# Patient Record
Sex: Female | Born: 1955
Health system: Southern US, Community
[De-identification: ages and names within clinical notes are randomized; demographics above are authoritative.]

## PROBLEM LIST (undated history)

## (undated) DIAGNOSIS — M25472 Effusion, left ankle: Secondary | ICD-10-CM

## (undated) DIAGNOSIS — M25471 Effusion, right ankle: Secondary | ICD-10-CM

## (undated) DIAGNOSIS — M25475 Effusion, left foot: Secondary | ICD-10-CM

## (undated) DIAGNOSIS — M2550 Pain in unspecified joint: Secondary | ICD-10-CM

## (undated) DIAGNOSIS — Z720 Tobacco use: Secondary | ICD-10-CM

## (undated) DIAGNOSIS — K219 Gastro-esophageal reflux disease without esophagitis: Secondary | ICD-10-CM

## (undated) DIAGNOSIS — K760 Fatty (change of) liver, not elsewhere classified: Secondary | ICD-10-CM

## (undated) DIAGNOSIS — I2510 Atherosclerotic heart disease of native coronary artery without angina pectoris: Secondary | ICD-10-CM

## (undated) DIAGNOSIS — M7122 Synovial cyst of popliteal space [Baker], left knee: Secondary | ICD-10-CM

## (undated) DIAGNOSIS — M549 Dorsalgia, unspecified: Secondary | ICD-10-CM

## (undated) DIAGNOSIS — M25474 Effusion, right foot: Secondary | ICD-10-CM

## (undated) DIAGNOSIS — I219 Acute myocardial infarction, unspecified: Secondary | ICD-10-CM

## (undated) HISTORY — DX: Acute myocardial infarction, unspecified: I21.9

## (undated) HISTORY — DX: Fatty (change of) liver, not elsewhere classified: K76.0

## (undated) HISTORY — DX: Dorsalgia, unspecified: M54.9

## (undated) HISTORY — DX: Effusion, right ankle: M25.471

## (undated) HISTORY — DX: Gastro-esophageal reflux disease without esophagitis: K21.9

## (undated) HISTORY — DX: Effusion, right foot: M25.474

## (undated) HISTORY — DX: Pain in unspecified joint: M25.50

## (undated) HISTORY — DX: Effusion, left foot: M25.475

## (undated) HISTORY — DX: Effusion, left ankle: M25.472

## (undated) HISTORY — PX: TONSILLECTOMY: SUR1361

---

## 2007-02-21 ENCOUNTER — Other Ambulatory Visit: Admission: RE | Admit: 2007-02-21 | Discharge: 2007-02-21 | Payer: Self-pay | Admitting: Obstetrics & Gynecology

## 2007-03-25 ENCOUNTER — Encounter: Admission: RE | Admit: 2007-03-25 | Discharge: 2007-03-25 | Payer: Self-pay | Admitting: Obstetrics & Gynecology

## 2007-04-23 ENCOUNTER — Ambulatory Visit (HOSPITAL_COMMUNITY): Admission: RE | Admit: 2007-04-23 | Discharge: 2007-04-23 | Payer: Self-pay | Admitting: Gastroenterology

## 2007-04-26 ENCOUNTER — Encounter: Admission: RE | Admit: 2007-04-26 | Discharge: 2007-04-26 | Payer: Self-pay | Admitting: Gastroenterology

## 2009-07-24 ENCOUNTER — Emergency Department (HOSPITAL_BASED_OUTPATIENT_CLINIC_OR_DEPARTMENT_OTHER): Admission: EM | Admit: 2009-07-24 | Discharge: 2009-07-24 | Payer: Self-pay | Admitting: Emergency Medicine

## 2010-01-19 ENCOUNTER — Ambulatory Visit: Payer: Self-pay | Admitting: Family Medicine

## 2010-03-19 LAB — ROCKY MTN SPOTTED FVR AB, IGG-BLOOD: RMSF IgG: 0.07 IV

## 2010-03-19 LAB — B. BURGDORFI ANTIBODIES: B burgdorferi Ab IgG+IgM: 0.28 {ISR}

## 2012-06-18 DIAGNOSIS — Z8349 Family history of other endocrine, nutritional and metabolic diseases: Secondary | ICD-10-CM | POA: Insufficient documentation

## 2012-06-18 DIAGNOSIS — K219 Gastro-esophageal reflux disease without esophagitis: Secondary | ICD-10-CM | POA: Insufficient documentation

## 2012-06-18 DIAGNOSIS — E785 Hyperlipidemia, unspecified: Secondary | ICD-10-CM | POA: Insufficient documentation

## 2012-06-18 DIAGNOSIS — F17210 Nicotine dependence, cigarettes, uncomplicated: Secondary | ICD-10-CM

## 2012-06-18 HISTORY — DX: Nicotine dependence, cigarettes, uncomplicated: F17.210

## 2012-06-19 ENCOUNTER — Other Ambulatory Visit: Payer: Self-pay

## 2012-06-19 DIAGNOSIS — Z1231 Encounter for screening mammogram for malignant neoplasm of breast: Secondary | ICD-10-CM

## 2012-06-24 ENCOUNTER — Ambulatory Visit: Payer: Self-pay

## 2012-08-12 ENCOUNTER — Ambulatory Visit: Payer: Self-pay | Admitting: Obstetrics and Gynecology

## 2013-11-17 ENCOUNTER — Other Ambulatory Visit: Payer: Self-pay | Admitting: Family Medicine

## 2013-11-17 DIAGNOSIS — Z1231 Encounter for screening mammogram for malignant neoplasm of breast: Secondary | ICD-10-CM

## 2013-11-18 ENCOUNTER — Ambulatory Visit
Admission: RE | Admit: 2013-11-18 | Discharge: 2013-11-18 | Disposition: A | Discharge status: Home / Self Care | Payer: 59 | Source: Ambulatory Visit | Attending: Family Medicine | Admitting: Family Medicine

## 2013-11-18 DIAGNOSIS — Z1231 Encounter for screening mammogram for malignant neoplasm of breast: Secondary | ICD-10-CM

## 2013-12-02 ENCOUNTER — Other Ambulatory Visit: Payer: Self-pay | Admitting: Family Medicine

## 2013-12-02 DIAGNOSIS — F17210 Nicotine dependence, cigarettes, uncomplicated: Secondary | ICD-10-CM

## 2013-12-23 ENCOUNTER — Encounter (INDEPENDENT_AMBULATORY_CARE_PROVIDER_SITE_OTHER): Payer: Self-pay

## 2013-12-23 ENCOUNTER — Ambulatory Visit
Admission: RE | Admit: 2013-12-23 | Discharge: 2013-12-23 | Disposition: A | Discharge status: Home / Self Care | Payer: No Typology Code available for payment source | Source: Ambulatory Visit | Attending: Family Medicine | Admitting: Family Medicine

## 2013-12-23 DIAGNOSIS — F17210 Nicotine dependence, cigarettes, uncomplicated: Secondary | ICD-10-CM

## 2014-07-27 ENCOUNTER — Other Ambulatory Visit: Payer: Self-pay | Admitting: Family Medicine

## 2014-07-27 DIAGNOSIS — M25562 Pain in left knee: Secondary | ICD-10-CM

## 2014-07-28 ENCOUNTER — Ambulatory Visit
Admission: RE | Admit: 2014-07-28 | Discharge: 2014-07-28 | Disposition: A | Discharge status: Home / Self Care | Payer: 59 | Source: Ambulatory Visit | Attending: Family Medicine | Admitting: Family Medicine

## 2014-07-28 DIAGNOSIS — M25562 Pain in left knee: Secondary | ICD-10-CM

## 2016-02-16 ENCOUNTER — Other Ambulatory Visit: Payer: Self-pay | Admitting: Family Medicine

## 2016-02-16 DIAGNOSIS — Z1231 Encounter for screening mammogram for malignant neoplasm of breast: Secondary | ICD-10-CM

## 2017-04-19 DIAGNOSIS — R519 Headache, unspecified: Secondary | ICD-10-CM | POA: Insufficient documentation

## 2017-04-19 DIAGNOSIS — L821 Other seborrheic keratosis: Secondary | ICD-10-CM | POA: Insufficient documentation

## 2017-04-19 DIAGNOSIS — M7989 Other specified soft tissue disorders: Secondary | ICD-10-CM | POA: Insufficient documentation

## 2017-04-19 DIAGNOSIS — M5412 Radiculopathy, cervical region: Secondary | ICD-10-CM | POA: Insufficient documentation

## 2017-04-19 DIAGNOSIS — M7122 Synovial cyst of popliteal space [Baker], left knee: Secondary | ICD-10-CM | POA: Insufficient documentation

## 2017-04-19 DIAGNOSIS — G518 Other disorders of facial nerve: Secondary | ICD-10-CM

## 2017-04-19 HISTORY — DX: Other disorders of facial nerve: G51.8

## 2017-12-22 ENCOUNTER — Encounter (HOSPITAL_COMMUNITY): Payer: Self-pay | Admitting: Emergency Medicine

## 2017-12-22 ENCOUNTER — Other Ambulatory Visit: Payer: Self-pay

## 2017-12-22 ENCOUNTER — Inpatient Hospital Stay (HOSPITAL_COMMUNITY)
Admission: EM | Admit: 2017-12-22 | Discharge: 2017-12-25 | DRG: 247 | Disposition: A | Discharge status: Home / Self Care | Payer: 59 | Attending: Internal Medicine | Admitting: Internal Medicine

## 2017-12-22 DIAGNOSIS — K219 Gastro-esophageal reflux disease without esophagitis: Secondary | ICD-10-CM | POA: Diagnosis present

## 2017-12-22 DIAGNOSIS — Z880 Allergy status to penicillin: Secondary | ICD-10-CM

## 2017-12-22 DIAGNOSIS — Z7289 Other problems related to lifestyle: Secondary | ICD-10-CM

## 2017-12-22 DIAGNOSIS — I214 Non-ST elevation (NSTEMI) myocardial infarction: Principal | ICD-10-CM | POA: Diagnosis present

## 2017-12-22 DIAGNOSIS — Z789 Other specified health status: Secondary | ICD-10-CM

## 2017-12-22 DIAGNOSIS — Z6841 Body Mass Index (BMI) 40.0 and over, adult: Secondary | ICD-10-CM

## 2017-12-22 DIAGNOSIS — I10 Essential (primary) hypertension: Secondary | ICD-10-CM | POA: Diagnosis present

## 2017-12-22 DIAGNOSIS — E7800 Pure hypercholesterolemia, unspecified: Secondary | ICD-10-CM | POA: Diagnosis present

## 2017-12-22 DIAGNOSIS — R030 Elevated blood-pressure reading, without diagnosis of hypertension: Secondary | ICD-10-CM | POA: Diagnosis present

## 2017-12-22 DIAGNOSIS — F17210 Nicotine dependence, cigarettes, uncomplicated: Secondary | ICD-10-CM | POA: Diagnosis present

## 2017-12-22 DIAGNOSIS — Z720 Tobacco use: Secondary | ICD-10-CM | POA: Diagnosis present

## 2017-12-22 DIAGNOSIS — E785 Hyperlipidemia, unspecified: Secondary | ICD-10-CM | POA: Diagnosis present

## 2017-12-22 HISTORY — DX: Morbid (severe) obesity due to excess calories: E66.01

## 2017-12-22 HISTORY — DX: Tobacco use: Z72.0

## 2017-12-22 HISTORY — DX: Synovial cyst of popliteal space (Baker), left knee: M71.22

## 2017-12-22 HISTORY — DX: Atherosclerotic heart disease of native coronary artery without angina pectoris: I25.10

## 2017-12-22 NOTE — ED Triage Notes (Addendum)
Pt presents with central chest pain with radiation to back and jaw. Patient states the pain happened earlier, she took aspirin and it subsided. Patient states that while she was sleeping the pain came back worse which brought her to be seen. Patient states had one bout of N/V.

## 2017-12-23 ENCOUNTER — Inpatient Hospital Stay (HOSPITAL_COMMUNITY): Payer: 59

## 2017-12-23 ENCOUNTER — Encounter (HOSPITAL_COMMUNITY): Payer: Self-pay | Admitting: Internal Medicine

## 2017-12-23 ENCOUNTER — Emergency Department (HOSPITAL_COMMUNITY): Payer: 59

## 2017-12-23 DIAGNOSIS — I10 Essential (primary) hypertension: Secondary | ICD-10-CM | POA: Diagnosis present

## 2017-12-23 DIAGNOSIS — I361 Nonrheumatic tricuspid (valve) insufficiency: Secondary | ICD-10-CM

## 2017-12-23 DIAGNOSIS — R030 Elevated blood-pressure reading, without diagnosis of hypertension: Secondary | ICD-10-CM | POA: Diagnosis present

## 2017-12-23 DIAGNOSIS — F17210 Nicotine dependence, cigarettes, uncomplicated: Secondary | ICD-10-CM | POA: Diagnosis present

## 2017-12-23 DIAGNOSIS — E7800 Pure hypercholesterolemia, unspecified: Secondary | ICD-10-CM | POA: Diagnosis present

## 2017-12-23 DIAGNOSIS — E785 Hyperlipidemia, unspecified: Secondary | ICD-10-CM | POA: Diagnosis present

## 2017-12-23 DIAGNOSIS — K219 Gastro-esophageal reflux disease without esophagitis: Secondary | ICD-10-CM | POA: Diagnosis present

## 2017-12-23 DIAGNOSIS — Z720 Tobacco use: Secondary | ICD-10-CM | POA: Diagnosis not present

## 2017-12-23 DIAGNOSIS — Z789 Other specified health status: Secondary | ICD-10-CM

## 2017-12-23 DIAGNOSIS — I214 Non-ST elevation (NSTEMI) myocardial infarction: Secondary | ICD-10-CM | POA: Diagnosis present

## 2017-12-23 DIAGNOSIS — Z880 Allergy status to penicillin: Secondary | ICD-10-CM | POA: Diagnosis not present

## 2017-12-23 DIAGNOSIS — Z7289 Other problems related to lifestyle: Secondary | ICD-10-CM

## 2017-12-23 DIAGNOSIS — Z6841 Body Mass Index (BMI) 40.0 and over, adult: Secondary | ICD-10-CM | POA: Diagnosis not present

## 2017-12-23 HISTORY — DX: Elevated blood-pressure reading, without diagnosis of hypertension: R03.0

## 2017-12-23 LAB — HEPATIC FUNCTION PANEL
ALBUMIN: 3.4 g/dL — AB (ref 3.5–5.0)
ALK PHOS: 65 U/L (ref 38–126)
ALT: 21 U/L (ref 0–44)
AST: 26 U/L (ref 15–41)
BILIRUBIN TOTAL: 0.8 mg/dL (ref 0.3–1.2)
Bilirubin, Direct: <0.1 mg/dL (ref 0.0–0.2)
TOTAL PROTEIN: 6.2 g/dL — AB (ref 6.5–8.1)

## 2017-12-23 LAB — CBC
HCT: 41.1 % (ref 36.0–46.0)
HCT: 42.9 % (ref 36.0–46.0)
Hemoglobin: 13.8 g/dL (ref 12.0–15.0)
Hemoglobin: 14.4 g/dL (ref 12.0–15.0)
MCH: 30.7 pg (ref 26.0–34.0)
MCH: 31.2 pg (ref 26.0–34.0)
MCHC: 33.6 g/dL (ref 30.0–36.0)
MCHC: 33.6 g/dL (ref 30.0–36.0)
MCV: 91.5 fL (ref 80.0–100.0)
MCV: 92.9 fL (ref 80.0–100.0)
PLATELETS: 235 10*3/uL (ref 150–400)
Platelets: 235 10*3/uL (ref 150–400)
RBC: 4.49 MIL/uL (ref 3.87–5.11)
RBC: 4.62 MIL/uL (ref 3.87–5.11)
RDW: 12.5 % (ref 11.5–15.5)
RDW: 12.5 % (ref 11.5–15.5)
WBC: 8.4 10*3/uL (ref 4.0–10.5)
WBC: 8.9 10*3/uL (ref 4.0–10.5)
nRBC: 0 % (ref 0.0–0.2)
nRBC: 0 % (ref 0.0–0.2)

## 2017-12-23 LAB — BASIC METABOLIC PANEL
ANION GAP: 11 (ref 5–15)
BUN: 19 mg/dL (ref 8–23)
CALCIUM: 9 mg/dL (ref 8.9–10.3)
CO2: 22 mmol/L (ref 22–32)
CREATININE: 0.82 mg/dL (ref 0.44–1.00)
Chloride: 108 mmol/L (ref 98–111)
GLUCOSE: 114 mg/dL — AB (ref 70–99)
Potassium: 3.6 mmol/L (ref 3.5–5.1)
Sodium: 141 mmol/L (ref 135–145)

## 2017-12-23 LAB — I-STAT TROPONIN, ED: TROPONIN I, POC: 0.25 ng/mL — AB (ref 0.00–0.08)

## 2017-12-23 LAB — ECHOCARDIOGRAM COMPLETE
Height: 65 in
Weight: 4180.8 oz

## 2017-12-23 LAB — HEPARIN LEVEL (UNFRACTIONATED)
HEPARIN UNFRACTIONATED: 0.43 [IU]/mL (ref 0.30–0.70)
Heparin Unfractionated: 0.17 [IU]/mL — ABNORMAL LOW (ref 0.30–0.70)

## 2017-12-23 LAB — D-DIMER, QUANTITATIVE: D-Dimer, Quant: 0.46 ug/mL-FEU (ref 0.00–0.50)

## 2017-12-23 LAB — TSH: TSH: 2.776 u[IU]/mL (ref 0.350–4.500)

## 2017-12-23 LAB — HEMOGLOBIN A1C
Hgb A1c MFr Bld: 5.2 % (ref 4.8–5.6)
Mean Plasma Glucose: 102.54 mg/dL

## 2017-12-23 LAB — TROPONIN I
Troponin I: 1.21 ng/mL
Troponin I: 2.02 ng/mL
Troponin I: 1.76 ng/mL (ref <0.03)

## 2017-12-23 LAB — MAGNESIUM: Magnesium: 2 mg/dL (ref 1.7–2.4)

## 2017-12-23 LAB — HIV ANTIBODY (ROUTINE TESTING W REFLEX): HIV Screen 4th Generation wRfx: NONREACTIVE

## 2017-12-23 MED ORDER — ACETAMINOPHEN 325 MG PO TABS
650.0000 mg | ORAL_TABLET | Freq: Four times a day (QID) | ORAL | Status: DC | PRN
  Administered 2017-12-24: 650 mg via ORAL
  Filled 2017-12-23: qty 2

## 2017-12-23 MED ORDER — ALUM & MAG HYDROXIDE-SIMETH 200-200-20 MG/5ML PO SUSP
30.0000 mL | Freq: Once | ORAL | Status: AC
  Administered 2017-12-23: 30 mL via ORAL
  Filled 2017-12-23: qty 30

## 2017-12-23 MED ORDER — METOPROLOL TARTRATE 12.5 MG HALF TABLET
12.5000 mg | ORAL_TABLET | Freq: Two times a day (BID) | ORAL | Status: DC
  Administered 2017-12-23 – 2017-12-24 (×4): 12.5 mg via ORAL
  Filled 2017-12-23 (×4): qty 1

## 2017-12-23 MED ORDER — ONDANSETRON HCL 4 MG/2ML IJ SOLN: 4.0000 mg | Freq: Four times a day (QID) | INTRAMUSCULAR | Status: DC | PRN

## 2017-12-23 MED ORDER — SODIUM CHLORIDE 0.9% FLUSH: 3.0000 mL | Freq: Two times a day (BID) | INTRAVENOUS | Status: DC

## 2017-12-23 MED ORDER — SODIUM CHLORIDE 0.9 % IV SOLN: 250.0000 mL | INTRAVENOUS | Status: DC | PRN

## 2017-12-23 MED ORDER — HEPARIN (PORCINE) 25000 UT/250ML-% IV SOLN
1450.0000 [IU]/h | INTRAVENOUS | Status: DC
  Administered 2017-12-23: 1450 [IU]/h via INTRAVENOUS
  Administered 2017-12-23: 1200 [IU]/h via INTRAVENOUS
  Filled 2017-12-23 (×2): qty 250

## 2017-12-23 MED ORDER — NICOTINE 21 MG/24HR TD PT24
21.0000 mg | MEDICATED_PATCH | Freq: Every day | TRANSDERMAL | Status: DC
  Administered 2017-12-23 – 2017-12-25 (×3): 21 mg via TRANSDERMAL
  Filled 2017-12-23 (×3): qty 1

## 2017-12-23 MED ORDER — ACETAMINOPHEN 325 MG PO TABS: 650.0000 mg | ORAL_TABLET | ORAL | Status: DC | PRN

## 2017-12-23 MED ORDER — ASPIRIN EC 81 MG PO TBEC
81.0000 mg | DELAYED_RELEASE_TABLET | Freq: Every day | ORAL | Status: DC
  Administered 2017-12-25: 81 mg via ORAL
  Filled 2017-12-23: qty 1

## 2017-12-23 MED ORDER — HEPARIN BOLUS VIA INFUSION
4000.0000 [IU] | Freq: Once | INTRAVENOUS | Status: AC
  Administered 2017-12-23: 4000 [IU] via INTRAVENOUS
  Filled 2017-12-23: qty 4000

## 2017-12-23 MED ORDER — SODIUM CHLORIDE 0.9 % WEIGHT BASED INFUSION: 1.0000 mL/kg/h | INTRAVENOUS | Status: DC

## 2017-12-23 MED ORDER — METOPROLOL TARTRATE 25 MG PO TABS: 25.0000 mg | ORAL_TABLET | Freq: Two times a day (BID) | ORAL | Status: DC

## 2017-12-23 MED ORDER — ASPIRIN 81 MG PO CHEW
162.0000 mg | CHEWABLE_TABLET | ORAL | Status: AC
  Administered 2017-12-23: 162 mg via ORAL
  Filled 2017-12-23: qty 2

## 2017-12-23 MED ORDER — ATORVASTATIN CALCIUM 80 MG PO TABS
80.0000 mg | ORAL_TABLET | Freq: Every day | ORAL | Status: DC
  Administered 2017-12-23 – 2017-12-24 (×2): 80 mg via ORAL
  Filled 2017-12-23 (×2): qty 1

## 2017-12-23 MED ORDER — ISOSORBIDE MONONITRATE ER 30 MG PO TB24: 15.0000 mg | ORAL_TABLET | Freq: Every day | ORAL | Status: DC

## 2017-12-23 MED ORDER — SODIUM CHLORIDE 0.9 % WEIGHT BASED INFUSION
3.0000 mL/kg/h | INTRAVENOUS | Status: DC
  Administered 2017-12-24: 3 mL/kg/h via INTRAVENOUS

## 2017-12-23 MED ORDER — NITROGLYCERIN 0.4 MG SL SUBL: 0.4000 mg | SUBLINGUAL_TABLET | SUBLINGUAL | Status: DC | PRN

## 2017-12-23 MED ORDER — HEPARIN BOLUS VIA INFUSION
2500.0000 [IU] | Freq: Once | INTRAVENOUS | Status: AC
  Administered 2017-12-23: 2500 [IU] via INTRAVENOUS
  Filled 2017-12-23: qty 2500

## 2017-12-23 MED ORDER — SODIUM CHLORIDE 0.9% FLUSH: 3.0000 mL | INTRAVENOUS | Status: DC | PRN

## 2017-12-23 MED ORDER — MORPHINE SULFATE (PF) 2 MG/ML IV SOLN: 2.0000 mg | INTRAVENOUS | Status: DC | PRN

## 2017-12-23 MED ORDER — ASPIRIN 81 MG PO CHEW
81.0000 mg | CHEWABLE_TABLET | ORAL | Status: AC
  Administered 2017-12-24: 81 mg via ORAL
  Filled 2017-12-23: qty 1

## 2017-12-23 NOTE — Progress Notes (Signed)
ANTICOAGULATION CONSULT NOTE - Initial Consult  Pharmacy Consult for Heparin Indication: chest pain/ACS  Allergies  Allergen Reactions  . Penicillins Other (See Comments)    Unknown reaction  DID THE REACTION INVOLVE: Swelling of the face/tongue/throat, SOB, or low BP? Unknown Sudden or severe rash/hives, skin peeling, or the inside of the mouth or nose? Unknown Did it require medical treatment? Unknown When did it last happen? If all above answers are "NO", may proceed with cephalosporin use.     Patient Measurements: Height: 5\' 5"  (165.1 cm) Weight: 266 lb (120.7 kg) IBW/kg (Calculated) : 57 Heparin Dosing Weight:   Vital Signs: Temp: 98 F (36.7 C) (12/21 2338) Temp Source: Oral (12/21 2338) BP: 159/102 (12/21 2338) Pulse Rate: 71 (12/21 2338)  Labs: Recent Labs    12/23/17 0034  HGB 14.4  HCT 42.9  PLT 235    CrCl cannot be calculated (No successful lab value found.).   Medical History: History reviewed. No pertinent past medical history.  Medications:  Infusions:  . heparin      Assessment: Patient with ACS.  No oral anticoagulants noted on med rec.    Goal of Therapy:  Heparin level 0.3-0.7 units/ml Monitor platelets by anticoagulation protocol: Yes   Plan:  Heparin bolus  4000 units iv x1 Heparin drip at 1200 units/hr Daily CBC Next heparin level at 1000    Darlina GuysGrimsley Jr, Jacquenette ShoneJulian Crowford 12/23/2017,1:36 AM

## 2017-12-23 NOTE — Consult Note (Addendum)
Cardiology Consultation:   Patient ID: Gina Morris MRN: [id number]; DOB: 12/16/1955  Admit date: 12/22/2017 Date of Consult: 12/23/2017  Primary Care Provider: Patient, No Pcp Per Primary Cardiologist: NEW Primary Electrophysiologist:  None    Patient Profile:   Gina Morris is a 62 y.o. female with a hx of tobacco use and obesity who is being seen today for the evaluation of chest pain, NSTEMI at the request of Dr. McClung.  History of Present Illness:   [name] with PMH of tobacco use and obesity, no prior cardiac hx. presented to ER at [facility] for chest pain.  She had the discomfort with waking yesterday and it lasted most of the day.  Described as tightness.  After she worked yesterday she came home and took 2 ASA.  She had mild relief with this.  Later at rest she had severe tightness in chest and a burning in her back and pain in her teeth.  She stood and had N&V. No SOB or diaphoreses.   She then came to ER.   EKG:  SR nonspecific T wave abnormality in inf leads I personally reviewed. Tele:  SR Troponin 0.25 and now 1.21 Na 141, K+ 3.6 Cr 0.82 Hgb 13.8, Plts 235, WBC 8.4  CXR 2 V  No acute cardiopulmonary process seen.  Currently no pain.  No complaints.  We discussed stopping tobacco + MI - she had planned to travel to [place] tomorrow so she cancelled.     Past Medical History:  Diagnosis Date  . Morbid obesity (HCC)   . Tobacco abuse     History reviewed. No pertinent surgical history.   Home Medications:  Prior to Admission medications   Medication Sig Start Date End Date Taking? Authorizing Provider  aspirin EC 325 MG tablet Take 650 mg by mouth every 6 (six) hours as needed for mild pain or moderate pain.   Yes [provider]  Lactobacillus (PROBIOTIC ACIDOPHILUS) CAPS Take 1 capsule by mouth daily.   Yes [provider]    Inpatient Medications: Scheduled Meds: . [START ON 12/24/2017] aspirin EC  81 mg Oral Daily  . atorvastatin   80 mg Oral q1800  . metoprolol tartrate  12.5 mg Oral BID  . nicotine  21 mg Transdermal Daily   Continuous Infusions: . heparin 1,200 Units/hr (12/23/17 0400)   PRN Meds: acetaminophen, morphine injection, ondansetron (ZOFRAN) IV  Allergies:    Allergies  Allergen Reactions  . Penicillins Other (See Comments)    Unknown reaction  DID THE REACTION INVOLVE: Swelling of the face/tongue/throat, SOB, or low BP? Unknown Sudden or severe rash/hives, skin peeling, or the inside of the mouth or nose? Unknown Did it require medical treatment? Unknown When did it last happen? If all above answers are "NO", may proceed with cephalosporin use.     Social History:   Social History   Socioeconomic History  . Marital status: Single    Spouse name: Not on file  . Number of children: Not on file  . Years of education: Not on file  . Highest education level: Not on file  Occupational History  . Not on file  Social Needs  . Financial resource strain: Not on file  . Food insecurity:    Worry: Not on file    Inability: Not on file  . Transportation needs:    Medical: Not on file    Non-medical: Not on file  Tobacco Use  . Smoking status: Current Every Day Smoker      Packs/day: 0.50    Types: Cigarettes  . Smokeless tobacco: Never Used  Substance and Sexual Activity  . Alcohol use: Yes  . Drug use: Never  . Sexual activity: Yes  Lifestyle  . Physical activity:    Days per week: Not on file    Minutes per session: Not on file  . Stress: Not on file  Relationships  . Social connections:    Talks on phone: Not on file    Gets together: Not on file    Attends religious service: Not on file    Active member of club or organization: Not on file    Attends meetings of clubs or organizations: Not on file    Relationship status: Not on file  . Intimate partner violence:    Fear of current or ex partner: Not on file    Emotionally abused: Not on file    Physically abused: Not  on file    Forced sexual activity: Not on file  Other Topics Concern  . Not on file  Social History Narrative  . Not on file    Family History:    Family History  Problem Relation Age of Onset  . Kidney disease Mother   . Pancreatic cancer Father      ROS:  Please see the history of present illness.  General:no colds or fevers, no weight changes Skin:no rashes or ulcers HEENT:no blurred vision, no congestion CV:see HPI PUL:see HPI GI:no diarrhea constipation or melena, no indigestion GU:no hematuria, no dysuria MS:no joint pain, no claudication Neuro:no syncope, no lightheadedness Endo:no diabetes, no thyroid disease  All other ROS reviewed and negative.     Physical Exam/Data:   Vitals:   12/22/17 2338 12/23/17 0113 12/23/17 0214 12/23/17 0311  BP: (!) 159/102  (!) 150/86 (!) 140/93  Pulse: 71  65 63  Resp: 20  18 18  Temp: 98 F (36.7 C)   97.6 F (36.4 C)  TempSrc: Oral   Oral  SpO2: 96%  95% 98%  Weight:  120.7 kg  118.5 kg  Height:  5' 5" (1.651 m)  5' 5" (1.651 m)    Intake/Output Summary (Last 24 hours) at 12/23/2017 0856 Last data filed at 12/23/2017 0400 Gross per 24 hour  Intake 26.37 ml  Output -  Net 26.37 ml   Filed Weights   12/23/17 0113 12/23/17 0311  Weight: 120.7 kg 118.5 kg   Body mass index is 43.48 kg/m.  General:  Well nourished, well developed, in no acute distress HEENT: normal Lymph: no adenopathy Neck: no JVD Endocrine:  No thryomegaly Vascular: No carotid bruits; pedal pulses 2+ bilaterally Cardiac:  normal S1, S2; RRR; no murmur gallup rub or click Lungs:  clear to auscultation bilaterally, no wheezing, rhonchi or rales  Abd: soft, nontender, no hepatomegaly  Ext: no edema Musculoskeletal:  No deformities, BUE and BLE strength normal and equal Skin: warm and dry  Neuro:  CNs 2-12 intact, no focal abnormalities noted Psych:  Normal affect    Relevant CV Studies: Echo ordered for today  Laboratory  Data:  Chemistry Recent Labs  Lab 12/23/17 0034  NA 141  K 3.6  CL 108  CO2 22  GLUCOSE 114*  BUN 19  CREATININE 0.82  CALCIUM 9.0  GFRNONAA >60  GFRAA >60  ANIONGAP 11    No results for input(s): PROT, ALBUMIN, AST, ALT, ALKPHOS, BILITOT in the last 168 hours. Hematology Recent Labs  Lab 12/23/17 0034 12/23/17 0439  WBC 8.9   8.4  RBC 4.62 4.49  HGB 14.4 13.8  HCT 42.9 41.1  MCV 92.9 91.5  MCH 31.2 30.7  MCHC 33.6 33.6  RDW 12.5 12.5  PLT 235 235   Cardiac Enzymes Recent Labs  Lab 12/23/17 0439  TROPONINI 1.21*    Recent Labs  Lab 12/23/17 0038  TROPIPOC 0.25*    BNPNo results for input(s): BNP, PROBNP in the last 168 hours.  DDimer  Recent Labs  Lab 12/23/17 0439  DDIMER 0.46    Radiology/Studies:  Dg Chest 2 View  Result Date: 12/23/2017 CLINICAL DATA:  Acute onset of central chest pain, radiating to the back and jaw. Nausea and vomiting. EXAM: CHEST - 2 VIEW COMPARISON:  CT of the chest performed 12/23/2013 FINDINGS: The lungs are well-aerated. Pulmonary vascularity is at the upper limits of normal. There is no evidence of focal opacification, pleural effusion or pneumothorax. The heart is normal in size; the mediastinal contour is within normal limits. No acute osseous abnormalities are seen. IMPRESSION: No acute cardiopulmonary process seen. Electronically Signed   By: Jeffery  Chang M.D.   On: 12/23/2017 00:23    Assessment and Plan:   NSTEMI-  Currently  Without any pain  Plan for cardiac cath tomorrow, continue IV heparin, will recheck EKG -agree with echo.   Will add BB low dose and will hold imdur due to hx of headaches. If chest pain returns will add IV NTG  .  And statin. Dr. Nishan to see  The patient understands that risks included but are not limited to stroke (1 in 1000), death (1 in 1000), kidney failure [usually temporary] (1 in 500), bleeding (1 in 200), allergic reaction [possibly serious] (1 in 200).    HTN elevated here, no hx of  HTN add BB  Tobacco use on Nicoderm patch.  Continue and discussed importance of stopping.     For questions or updates, please contact [facility] Please consult [email or web address] for contact info under     Signed, Laura Ingold, NP  12/23/2017 8:56 AM   Patient examined chart reviewed Obese bronchitic female clear lungs no murmur good right radial pulse SEMI no acute ECG changes Pain free Risk of cath including stroke , bleeding , contrast reaction MI and Need for CABG discussed. Willing to proceed. Add beta blocker nicoderm patch Discussed need to stop Smoking  Peter Nishan   

## 2017-12-23 NOTE — Plan of Care (Signed)
Care plans reviewed and patient is progressing.  

## 2017-12-23 NOTE — H&P (Signed)
History and Physical    Gina Morris:[id number] DOB: 1955/12/10 DOA: 12/22/2017  Referring MD/NP/PA: Dione Boozeavid Glick, MD PCP: Gina Morris, No Pcp Per  Gina Morris coming from: home  Chief Complaint: Chest pain  I have personally briefly reviewed Gina Morris[facility]   HPI: Gina Morris is a 62 y.o. female with medical history significant of tobacco abuse and obesity; who presents with complaints of chest pain. Gina Morris reported waking up yesterday morning and feeling uncomfortable in her chest for much of the day like a tightness sensation.  Initially she disregarded symptoms thinking that it might be indigestion, but notes that it felt different. After she got home from work she took 2 aspirin sometime around 9 or 10 PM when she got home.  She noted some mild relief in symptoms.  However, later on while sitting down had severe tightness in her chest with a burning sensation in her back and pain in her teeth.  She stood up and reported immediately feeling nauseous and vomiting 1 time.  Denies having any shortness of breath diaphoresis, abdominal pain, or loss of consciousness.    She admits to smoking half a pack cigarettes per day and has done so for at least 40 years.  Normally she has 1 glass of wine approximately 4 ounces per night.  Denies having any withdrawal symptoms if she misses a day or 2 of not drinking. she has had associated symptoms of some mild intermittent left ankle swelling and unknown amount of weight gain.  Denies any  illicit drug use, history of blood clots, prior heart disease, recent travel, calf pain, dysuria, fever, or chills.  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   ED Course: On admission into the emergency department Gina Morris was noted to be afebrile, blood pressure 159/102, and all other vital signs stable.  Labs were significant for troponin of 0.25.  Chest x-ray showed no acute abnormalities.   Gina Morris was started on a heparin drip per pharmacy.  Cardiology was consulted and recommended [facility].  Gina Morris reports being currently chest pain-free at this time, but does not know what made symptoms improved.  Review of Systems  Constitutional: Positive for malaise/fatigue. Negative for chills and fever.  HENT: Negative for congestion and sinus pain.   Eyes: Negative for photophobia and pain.  Respiratory: Negative for cough and shortness of breath.   Cardiovascular: Positive for chest pain and leg swelling. Negative for orthopnea and PND.  Gastrointestinal: Positive for nausea and vomiting. Negative for abdominal pain and diarrhea.  Genitourinary: Negative for dysuria and hematuria.  Musculoskeletal: Negative for falls and joint pain.  Skin: Negative for rash.  Neurological: Negative for loss of consciousness and weakness.  Psychiatric/Behavioral: Negative for memory loss.    Past Medical History:  Diagnosis Date  . Tobacco abuse     History reviewed. No pertinent surgical history.   reports that she has been smoking cigarettes. She has been smoking about 0.50 packs per day. She has never used smokeless tobacco. She reports current alcohol use. She reports that she does not use drugs.  Allergies  Allergen Reactions  . Penicillins Other (See Comments)    Unknown reaction  DID THE REACTION INVOLVE: Swelling of the face/tongue/throat, SOB, or low BP? Unknown Sudden or severe rash/hives, skin peeling, or the inside of the mouth or nose? Unknown Did it require medical treatment? Unknown When did it last happen? If all  above answers are "NO", may proceed with cephalosporin use.     Family History  Problem Relation Age of Onset  . Kidney disease Mother   . Pancreatic cancer Father     Prior to Admission medications   Medication Sig Start Date End Date Taking? Authorizing Provider  aspirin EC 325 MG tablet Take 650 mg by mouth every 6 (six) hours as  needed for mild pain or moderate pain.   Yes [provider]  Lactobacillus (PROBIOTIC ACIDOPHILUS) CAPS Take 1 capsule by mouth daily.   Yes [provider]    Physical Exam:  Constitutional: Obese female NAD, calm, comfortable Vitals:   12/22/17 2338 12/23/17 0113  BP: (!) 159/102   Pulse: 71   Resp: 20   Temp: 98 F (36.7 C)   TempSrc: Oral   SpO2: 96%   Weight:  120.7 kg  Height:  5\' 5"  (1.651 m)   Eyes: PERRL, lids and conjunctivae normal ENMT: Mucous membranes are moist. Posterior pharynx clear of any exudate or lesions.Normal dentition.  Neck: normal, supple, no masses, no thyromegaly Respiratory: clear to auscultation bilaterally, no wheezing, no crackles. Normal respiratory effort. No accessory muscle use.  Cardiovascular: Regular rate and rhythm, no murmurs / rubs / gallops.  Mild swelling of the left leg.  Extremity edema. 2+ pedal pulses. No carotid bruits.  Chest pain not reproducible with palpation. Abdomen: no tenderness, no masses palpated. No hepatosplenomegaly. Bowel sounds positive.  Musculoskeletal: no clubbing / cyanosis. No joint deformity upper and lower extremities. Good ROM, no contractures. Normal muscle tone.  Skin: no rashes, lesions, ulcers. No induration Neurologic: CN 2-12 grossly intact. Sensation intact, DTR normal. Strength 5/5 in all 4.  Psychiatric: Normal judgment and insight. Alert and oriented x 3. Normal mood.     Labs on Admission: I have personally reviewed following labs and imaging studies  CBC: Recent Labs  Lab 12/23/17 0034  WBC 8.9  HGB 14.4  HCT 42.9  MCV 92.9  PLT 235   Basic Metabolic Panel: No results for input(s): NA, K, CL, CO2, GLUCOSE, BUN, CREATININE, CALCIUM, MG, PHOS in the last 168 hours. GFR: CrCl cannot be calculated (No successful lab value found.). Liver Function Tests: No results for input(s): AST, ALT, ALKPHOS, BILITOT, PROT, ALBUMIN in the last 168 hours. No results for input(s):  LIPASE, AMYLASE in the last 168 hours. No results for input(s): AMMONIA in the last 168 hours. Coagulation Profile: No results for input(s): INR, PROTIME in the last 168 hours. Cardiac Enzymes: No results for input(s): CKTOTAL, CKMB, CKMBINDEX, TROPONINI in the last 168 hours. BNP (last 3 results) No results for input(s): PROBNP in the last 8760 hours. HbA1C: No results for input(s): HGBA1C in the last 72 hours. CBG: No results for input(s): GLUCAP in the last 168 hours. Lipid Profile: No results for input(s): CHOL, HDL, LDLCALC, TRIG, CHOLHDL, LDLDIRECT in the last 72 hours. Thyroid Function Tests: No results for input(s): TSH, T4TOTAL, FREET4, T3FREE, THYROIDAB in the last 72 hours. Anemia Panel: No results for input(s): VITAMINB12, FOLATE, FERRITIN, TIBC, IRON, RETICCTPCT in the last 72 hours. Urine analysis: No results found for: COLORURINE, APPEARANCEUR, LABSPEC, PHURINE, GLUCOSEU, HGBUR, BILIRUBINUR, KETONESUR, PROTEINUR, UROBILINOGEN, NITRITE, LEUKOCYTESUR Sepsis Labs: No results found for this or any previous visit (from the past 240 hour(s)).   Radiological Exams on Admission: Dg Chest 2 View  Result Date: 12/23/2017 CLINICAL DATA:  Acute onset of central chest pain, radiating to the back and jaw. Nausea and vomiting. EXAM: CHEST -  2 VIEW COMPARISON:  CT of the chest performed 12/23/2013 FINDINGS: The lungs are well-aerated. Pulmonary vascularity is at the upper limits of normal. There is no evidence of focal opacification, pleural effusion or pneumothorax. The heart is normal in size; the mediastinal contour is within normal limits. No acute osseous abnormalities are seen. IMPRESSION: No acute cardiopulmonary process seen. Electronically Signed   By: Roanna RaiderJeffery  Chang M.D.   On: 12/23/2017 00:23    EKG: Independently reviewed.  Sinus arrhythmia at 73 bpm  Assessment/Plan NSTEMI: Acute.  Gina Morris presents with acute complaints of chest pain radiating to the back and to her  teeth.  Gina Morris took 162 mg aspirin around 9/10 p.m.  Initial EKG showed no significant signs of ischemia, but troponin elevated at 0.25.  Gina Morris was started on heparin drip and cardiology consulted.  Gina Morris currently chest pain-free.  Risk factors include obesity, tobacco use, and possible undiagnosed hypertension. - [facility] - Chest pain order set initiated - Give additional 162 mg of aspirin to chew now - Check d-dimer and urine drug screen - Continue to trend troponin and check echocardiogram in a.m. - Continue Heparin per pharmacy - Nitroglycerin and morphine as needed for pain  Tobacco abuse: Gina Morris reports at least 20 smoking pack year history. - Counseled on need of cessation of tobacco use - Nicotine patch offered  Elevated blood pressure reading: Gina Morris initial blood pressures while in the emergency department 150/85-159/102.  Hyperlipidemia: Care everywhere records shows Gina Morris's total cholesterol on 12/20 was 225, total HDL 52, triglycerides 110, LDL 150. - Start statin  Alcohol use: Gina Morris reports drinking 1 glass of wine 4 ounces per night and denies any history of alcohol withdrawal symptoms.  Morbidly obesity: BMI 44.26  DVT prophylaxis: Heparin Code Status: Full Family Communication: Family present at bedside Disposition Plan: To be determined based off clinical course given signs of possible blockage Consults called: Cardiology  Admission status: observation  Clydie Braunondell A Reola Buckles MD [facility] Pager [phone]   If 7PM-7AM, please contact night-coverage [email or web address] Password [email or web address]  12/23/2017, 1:47 AM

## 2017-12-23 NOTE — Progress Notes (Signed)
CRITICAL VALUE ALERT  Critical Value: Troponin 1.21  Date & Time Notied:  12/23/2017 by lab @ 6:00  Provider Notified: Dr. Rana SnareBodenheimer   Orders Received/Actions taken: MD notified. Awaiting orders. Will continue to monitor.

## 2017-12-23 NOTE — Progress Notes (Signed)
ANTICOAGULATION CONSULT NOTE  Pharmacy Consult for Heparin Indication: chest pain/ACS  Allergies  Allergen Reactions  . Penicillins Other (See Comments)    Unknown reaction  DID THE REACTION INVOLVE: Swelling of the face/tongue/throat, SOB, or low BP? Unknown Sudden or severe rash/hives, skin peeling, or the inside of the mouth or nose? Unknown Did it require medical treatment? Unknown When did it last happen? If all above answers are "NO", may proceed with cephalosporin use.     Patient Measurements: Height: 5\' 5"  (165.1 cm) Weight: 261 lb 4.8 oz (118.5 kg) IBW/kg (Calculated) : 57 Heparin Dosing Weight: 85 kg  Vital Signs: Temp: 98.2 F (36.8 C) (12/22 0900) Temp Source: Oral (12/22 0900) BP: 144/67 (12/22 1053) Pulse Rate: 67 (12/22 1053)  Labs: Recent Labs    12/23/17 0034 12/23/17 0439 12/23/17 0932 12/23/17 0933  HGB 14.4 13.8  --   --   HCT 42.9 41.1  --   --   PLT 235 235  --   --   HEPARINUNFRC  --   --  0.17*  --   CREATININE 0.82  --   --   --   TROPONINI  --  1.21*  --  2.02*    Estimated Creatinine Clearance: 91.6 mL/min (by C-G formula based on SCr of 0.82 mg/dL).  Assessment: 62 y.o. F on heparin for NSTEMI. Heparin level subtherapeutic (0.17) on gtt at 1200 units/hr. No issues with line or bleeding reported per RN. Plan for cardiac cath tomorrow.   Goal of Therapy:  Heparin level 0.3-0.7 units/ml Monitor platelets by anticoagulation protocol: Yes   Plan:  Rebolus heparin 2500 units Increase heparin drip to 1450 units/hr Will f/u heparin level in 6 hours Daily heparin level and CBC  Christoper Fabianaron Nikira Kushnir, PharmD, BCPS Clinical pharmacist  **Pharmacist phone directory can now be found on [email or web address] (PW [email or web address]).  Listed under [facility]. 12/23/2017,12:16 PM

## 2017-12-23 NOTE — Progress Notes (Signed)
*  PRELIMINARY RESULTS* Echocardiogram 2D Echocardiogram has been performed.  Stacey DrainWhite, Justine Cossin J 12/23/2017, 3:45 PM

## 2017-12-23 NOTE — Progress Notes (Signed)
ANTICOAGULATION CONSULT NOTE  Pharmacy Consult for Heparin Indication: chest pain/ACS  Allergies  Allergen Reactions  . Penicillins Other (See Comments)    Unknown reaction  DID THE REACTION INVOLVE: Swelling of the face/tongue/throat, SOB, or low BP? Unknown Sudden or severe rash/hives, skin peeling, or the inside of the mouth or nose? Unknown Did it require medical treatment? Unknown When did it last happen? If all above answers are "NO", may proceed with cephalosporin use.     Patient Measurements: Height: 5\' 5"  (165.1 cm) Weight: 261 lb 4.8 oz (118.5 kg) IBW/kg (Calculated) : 57 Heparin Dosing Weight: 85 kg  Vital Signs: Temp: 98.3 F (36.8 C) (12/22 1729) Temp Source: Oral (12/22 1729) BP: 145/82 (12/22 1729) Pulse Rate: 101 (12/22 1729)  Labs: Recent Labs    12/23/17 0034 12/23/17 0439 12/23/17 0932 12/23/17 0933 12/23/17 1641 12/23/17 1830  HGB 14.4 13.8  --   --   --   --   HCT 42.9 41.1  --   --   --   --   PLT 235 235  --   --   --   --   HEPARINUNFRC  --   --  0.17*  --   --  0.43  CREATININE 0.82  --   --   --   --   --   TROPONINI  --  1.21*  --  2.02* 1.76*  --     Estimated Creatinine Clearance: 91.6 mL/min (by C-G formula based on SCr of 0.82 mg/dL).  Assessment: 62 y.o. F on heparin for NSTEMI. Heparin level subtherapeutic (0.17) on gtt at 1200 units/hr. No issues with line or bleeding reported per RN. Plan for cardiac cath tomorrow.  Heparin level therapeutic at 0.43.    Goal of Therapy:  Heparin level 0.3-0.7 units/ml Monitor platelets by anticoagulation protocol: Yes   Plan:   Continue heparin drip at 1450 units/hr Will f/u heparin level in AM Daily heparin level and CBC  Elexius Minar A. Jeanella CrazePierce, PharmD, BCPS Clinical Pharmacist [place] Pager: [phone] Please utilize Amion for appropriate phone number to reach the unit pharmacist [facility])   12/23/2017,7:51 PM

## 2017-12-23 NOTE — ED Provider Notes (Signed)
[facility]-EMERGENCY DEPT Provider Note   CSN: [id number] Arrival date & time: 12/22/17  2321     History   Chief Complaint Chief Complaint  Patient presents with  . Chest Pain  . Back Pain    HPI Gina Morris is a 62 y.o. female.  The history is provided by the patient.  She has no significant past history and comes in because of chest discomfort through the day.  She describes a tight feeling in her chest through most of the day.  There was no associated dyspnea, nausea, diaphoresis.  She did take a dose of aspirin, and the pain went away, but then recurred and was severe.  She rated the tight feeling at 10/10.  There was associated burning pain in her upper back.  This time, she did have nausea and vomited once.  Pain did not seem to be affected by deep breathing or by exertion.  She did not try lying down.  She has not taken any medicine other than the aspirin.  Pain resolves upon arriving in the ED.  She denies history of hypertension, diabetes, hyperlipidemia.  There is no family history of premature coronary atherosclerosis.  She does smoke half pack of cigarettes a day.  History reviewed. No pertinent past medical history.  There are no active problems to display for this patient.   History reviewed. No pertinent surgical history.   OB History   No obstetric history on file.      Home Medications    Prior to Admission medications   Not on File    Family History No family history on file.  Social History Social History   Tobacco Use  . Smoking status: Current Every Day Smoker    Packs/day: 0.50    Types: Cigarettes  . Smokeless tobacco: Never Used  Substance Use Topics  . Alcohol use: Yes  . Drug use: Never     Allergies   Penicillins   Review of Systems Review of Systems  All other systems reviewed and are negative.    Physical Exam Updated Vital Signs BP (!) 159/102 (BP Location: Right Arm)   Pulse 71   Temp 98  F (36.7 C) (Oral)   Resp 20   SpO2 96%   Physical Exam Vitals signs and nursing note reviewed.    62 year old female, resting comfortably and in no acute distress. Vital signs are significant for elevated blood pressure. Oxygen saturation is 96%, which is normal. Head is normocephalic and atraumatic. PERRLA, EOMI. Oropharynx is clear. Neck is nontender and supple without adenopathy or JVD. Back is nontender and there is no CVA tenderness. Lungs are clear without rales, wheezes, or rhonchi. Chest is nontender. Heart has regular rate and rhythm without murmur. Abdomen is soft, flat, nontender without masses or hepatosplenomegaly and peristalsis is normoactive. Extremities have no cyanosis or edema, full range of motion is present. Skin is warm and dry without rash. Neurologic: Mental status is normal, cranial nerves are intact, there are no motor or sensory deficits.  ED Treatments / Results  Labs (all labs ordered are listed, but only abnormal results are displayed) Labs Reviewed  BASIC METABOLIC PANEL - Abnormal; Notable for the following components:      Result Value   Glucose, Bld 114 (*)    All other components within normal limits  I-STAT TROPONIN, ED - Abnormal; Notable for the following components:   Troponin i, poc 0.25 (*)    All other components within  normal limits  CBC  CBC    EKG EKG Interpretation  Date/Time:  Saturday December 22 2017 23:42:23 EST Ventricular Rate:  73 PR Interval:    QRS Duration: 91 QT Interval:  397 QTC Calculation: 438 R Axis:   55 Text Interpretation:  Sinus arrhythmia Abnormal R-wave progression, early transition Baseline wander in lead(s) V3 No old tracing to compare Confirmed by Dione BoozeGlick, Arael Piccione ([id number]) on 12/22/2017 11:46:12 PM   Radiology Dg Chest 2 View  Result Date: 12/23/2017 CLINICAL DATA:  Acute onset of central chest pain, radiating to the back and jaw. Nausea and vomiting. EXAM: CHEST - 2 VIEW COMPARISON:  CT of the  chest performed 12/23/2013 FINDINGS: The lungs are well-aerated. Pulmonary vascularity is at the upper limits of normal. There is no evidence of focal opacification, pleural effusion or pneumothorax. The heart is normal in size; the mediastinal contour is within normal limits. No acute osseous abnormalities are seen. IMPRESSION: No acute cardiopulmonary process seen. Electronically Signed   By: Roanna RaiderJeffery  Chang M.D.   On: 12/23/2017 00:23    Procedures Procedures  CRITICAL CARE Performed by: Dione Boozeavid Rhema Boyett Total critical care time: 50 minutes Critical care time was exclusive of separately billable procedures and treating other patients. Critical care was necessary to treat or prevent imminent or life-threatening deterioration. Critical care was time spent personally by me on the following activities: development of treatment plan with patient and/or surrogate as well as nursing, discussions with consultants, evaluation of patient's response to treatment, examination of patient, obtaining history from patient or surrogate, ordering and performing treatments and interventions, ordering and review of laboratory studies, ordering and review of radiographic studies, pulse oximetry and re-evaluation of patient's condition.)  Medications Ordered in ED Medications  heparin ADULT infusion 100 units/mL (25000 units/27550mL sodium chloride 0.45%) (1,200 Units/hr Intravenous Transfusing/Transfer 12/23/17 0202)  heparin bolus via infusion 4,000 Units (4,000 Units Intravenous Bolus from Bag 12/23/17 0142)     Initial Impression / Assessment and Plan / ED Course  I have reviewed the triage vital signs and the nursing notes.  Pertinent labs & imaging results that were available during my care of the patient were reviewed by me and considered in my medical decision making (see chart for details).  Chest pain which appears somewhat atypical.  ECG shows no acute ST or T changes.  Chest x-ray is unremarkable.  She  already took aspirin at home.  Will check screening labs including troponin.  If negative, will also need delta troponin.  Troponin has come back elevated at 0.25 indicating patient has had a NSTEMI.  She continues to be pain-free.  She is started on heparin.  Case is discussed with Dr. Katrinka BlazingSmith of [facility], who agrees to admit the patient and have [facility].  Case also discussed with Dr. Santiago Gladarncelli, on-call for cardiology, who agrees to see the patient in consultation [facility].  Final Clinical Impressions(s) / ED Diagnoses   Final diagnoses:  NSTEMI (non-ST elevated myocardial infarction) [facility](HCC)    ED Discharge Orders    None       Dione BoozeGlick, Celesta Funderburk, MD 12/23/17 [phone]

## 2017-12-23 NOTE — H&P (View-Only) (Signed)
Cardiology Consultation:   Patient ID: Gina Morris MRN: [id number]; DOB: February 04, 1955  Admit date: 12/22/2017 Date of Consult: 12/23/2017  Primary Care Provider: Patient, No Pcp Per Primary Cardiologist: NEW Primary Electrophysiologist:  None    Patient Profile:   Gina Morris is a 62 y.o. female with a hx of tobacco use and obesity who is being seen today for the evaluation of chest pain, NSTEMI at the request of Dr. Sharon Seller.  History of Present Illness:   Gina Morris with PMH of tobacco use and obesity, no prior cardiac hx. presented to ER at [facility] for chest pain.  She had the discomfort with waking yesterday and it lasted most of the day.  Described as tightness.  After she worked yesterday she came home and took 2 ASA.  She had mild relief with this.  Later at rest she had severe tightness in chest and a burning in her back and pain in her teeth.  She stood and had N&V. No SOB or diaphoreses.   She then came to ER.   EKG:  SR nonspecific T wave abnormality in inf leads I personally reviewed. Tele:  SR Troponin 0.25 and now 1.21 Na 141, K+ 3.6 Cr 0.82 Hgb 13.8, Plts 235, WBC 8.4  CXR 2 V  No acute cardiopulmonary process seen.  Currently no pain.  No complaints.  We discussed stopping tobacco + MI - she had planned to travel to [facility] tomorrow so she cancelled.     Past Medical History:  Diagnosis Date  . Morbid obesity (HCC)   . Tobacco abuse     History reviewed. No pertinent surgical history.   Home Medications:  Prior to Admission medications   Medication Sig Start Date End Date Taking? Authorizing Provider  aspirin EC 325 MG tablet Take 650 mg by mouth every 6 (six) hours as needed for mild pain or moderate pain.   Yes [provider]  Lactobacillus (PROBIOTIC ACIDOPHILUS) CAPS Take 1 capsule by mouth daily.   Yes [provider]    Inpatient Medications: Scheduled Meds: . [START ON 12/24/2017] aspirin EC  81 mg Oral Daily  . atorvastatin   80 mg Oral q1800  . metoprolol tartrate  12.5 mg Oral BID  . nicotine  21 mg Transdermal Daily   Continuous Infusions: . heparin 1,200 Units/hr (12/23/17 0400)   PRN Meds: acetaminophen, morphine injection, ondansetron (ZOFRAN) IV  Allergies:    Allergies  Allergen Reactions  . Penicillins Other (See Comments)    Unknown reaction  DID THE REACTION INVOLVE: Swelling of the face/tongue/throat, SOB, or low BP? Unknown Sudden or severe rash/hives, skin peeling, or the inside of the mouth or nose? Unknown Did it require medical treatment? Unknown When did it last happen? If all above answers are "NO", may proceed with cephalosporin use.     Social History:   Social History   Socioeconomic History  . Marital status: Single    Spouse name: Not on file  . Number of children: Not on file  . Years of education: Not on file  . Highest education level: Not on file  Occupational History  . Not on file  Social Needs  . Financial resource strain: Not on file  . Food insecurity:    Worry: Not on file    Inability: Not on file  . Transportation needs:    Medical: Not on file    Non-medical: Not on file  Tobacco Use  . Smoking status: Current Every Day Smoker  Packs/day: 0.50    Types: Cigarettes  . Smokeless tobacco: Never Used  Substance and Sexual Activity  . Alcohol use: Yes  . Drug use: Never  . Sexual activity: Yes  Lifestyle  . Physical activity:    Days per week: Not on file    Minutes per session: Not on file  . Stress: Not on file  Relationships  . Social connections:    Talks on phone: Not on file    Gets together: Not on file    Attends religious service: Not on file    Active member of club or organization: Not on file    Attends meetings of clubs or organizations: Not on file    Relationship status: Not on file  . Intimate partner violence:    Fear of current or ex partner: Not on file    Emotionally abused: Not on file    Physically abused: Not  on file    Forced sexual activity: Not on file  Other Topics Concern  . Not on file  Social History Narrative  . Not on file    Family History:    Family History  Problem Relation Age of Onset  . Kidney disease Mother   . Pancreatic cancer Father      ROS:  Please see the history of present illness.  General:no colds or fevers, no weight changes Skin:no rashes or ulcers HEENT:no blurred vision, no congestion CV:see HPI PUL:see HPI GI:no diarrhea constipation or melena, no indigestion GU:no hematuria, no dysuria MS:no joint pain, no claudication Neuro:no syncope, no lightheadedness Endo:no diabetes, no thyroid disease  All other ROS reviewed and negative.     Physical Exam/Data:   Vitals:   12/22/17 2338 12/23/17 0113 12/23/17 0214 12/23/17 0311  BP: (!) 159/102  (!) 150/86 (!) 140/93  Pulse: 71  65 63  Resp: 20  18 18   Temp: 98 F (36.7 C)   97.6 F (36.4 C)  TempSrc: Oral   Oral  SpO2: 96%  95% 98%  Weight:  120.7 kg  118.5 kg  Height:  5\' 5"  (1.651 m)  5\' 5"  (1.651 m)    Intake/Output Summary (Last 24 hours) at 12/23/2017 0856 Last data filed at 12/23/2017 0400 Gross per 24 hour  Intake 26.37 ml  Output -  Net 26.37 ml   Filed Weights   12/23/17 0113 12/23/17 0311  Weight: 120.7 kg 118.5 kg   Body mass index is 43.48 kg/m.  General:  Well nourished, well developed, in no acute distress HEENT: normal Lymph: no adenopathy Neck: no JVD Endocrine:  No thryomegaly Vascular: No carotid bruits; pedal pulses 2+ bilaterally Cardiac:  normal S1, S2; RRR; no murmur gallup rub or click Lungs:  clear to auscultation bilaterally, no wheezing, rhonchi or rales  Abd: soft, nontender, no hepatomegaly  Ext: no edema Musculoskeletal:  No deformities, BUE and BLE strength normal and equal Skin: warm and dry  Neuro:  CNs 2-12 intact, no focal abnormalities noted Psych:  Normal affect    Relevant CV Studies: Echo ordered for today  Laboratory  Data:  Chemistry Recent Labs  Lab 12/23/17 0034  NA 141  K 3.6  CL 108  CO2 22  GLUCOSE 114*  BUN 19  CREATININE 0.82  CALCIUM 9.0  GFRNONAA >60  GFRAA >60  ANIONGAP 11    No results for input(s): PROT, ALBUMIN, AST, ALT, ALKPHOS, BILITOT in the last 168 hours. Hematology Recent Labs  Lab 12/23/17 0034 12/23/17 0439  WBC 8.9  8.4  RBC 4.62 4.49  HGB 14.4 13.8  HCT 42.9 41.1  MCV 92.9 91.5  MCH 31.2 30.7  MCHC 33.6 33.6  RDW 12.5 12.5  PLT 235 235   Cardiac Enzymes Recent Labs  Lab 12/23/17 0439  TROPONINI 1.21*    Recent Labs  Lab 12/23/17 0038  TROPIPOC 0.25*    BNPNo results for input(s): BNP, PROBNP in the last 168 hours.  DDimer  Recent Labs  Lab 12/23/17 0439  DDIMER 0.46    Radiology/Studies:  Dg Chest 2 View  Result Date: 12/23/2017 CLINICAL DATA:  Acute onset of central chest pain, radiating to the back and jaw. Nausea and vomiting. EXAM: CHEST - 2 VIEW COMPARISON:  CT of the chest performed 12/23/2013 FINDINGS: The lungs are well-aerated. Pulmonary vascularity is at the upper limits of normal. There is no evidence of focal opacification, pleural effusion or pneumothorax. The heart is normal in size; the mediastinal contour is within normal limits. No acute osseous abnormalities are seen. IMPRESSION: No acute cardiopulmonary process seen. Electronically Signed   By: Roanna RaiderJeffery  Chang M.D.   On: 12/23/2017 00:23    Assessment and Plan:   NSTEMI-  Currently  Without any pain  Plan for cardiac cath tomorrow, continue IV heparin, will recheck EKG -agree with echo.   Will add BB low dose and will hold imdur due to hx of headaches. If chest pain returns will add IV NTG  .  And statin. Dr. Eden EmmsNishan to see  The patient understands that risks included but are not limited to stroke (1 in 1000), death (1 in 1000), kidney failure [usually temporary] (1 in 500), bleeding (1 in 200), allergic reaction [possibly serious] (1 in 200).    HTN elevated here, no hx of  HTN add BB  Tobacco use on Nicoderm patch.  Continue and discussed importance of stopping.     For questions or updates, please contact [facility] Please consult [email or web address] for contact info under     Signed, Nada BoozerLaura Ingold, NP  12/23/2017 8:56 AM   Patient examined chart reviewed Obese bronchitic female clear lungs no murmur good right radial pulse SEMI no acute ECG changes Pain free Risk of cath including stroke , bleeding , contrast reaction MI and Need for CABG discussed. Willing to proceed. Add beta blocker nicoderm patch Discussed need to stop Smoking  Charlton HawsPeter Clarivel Callaway

## 2017-12-23 NOTE — Progress Notes (Signed)
  [place] TEAM 1 - Stepdown/ICU TEAM  Renaldo ReelFrances Morris  YQM:[id number] DOB: 1955/05/01 DOA: 12/22/2017 PCP: Patient, No Pcp Per    Brief Narrative:  62yo F w/ a hx of tobacco abuse and obesity who presented w/ chest pain described as presure.  In the ED labs were significant for troponin of 0.25.  Chest x-ray showed no acute abnormalities.    Significant Events: 12/22 admit   Subjective: Pt is seen for a f/u visit.    Assessment & Plan:  NSTEMI  Tobacco abuse  Elevated BP No prior hx of HTN  HLD  Morbid obesity - Body mass index is 43.48 kg/m.   DVT prophylaxis: IV heparin Code Status: FULL CODE Family Communication:  Disposition Plan:   Consultants:  Cardiology   Antimicrobials:  none   Objective: Blood pressure (!) 144/67, pulse 67, temperature 98.2 F (36.8 C), temperature source Oral, resp. rate 18, height 5\' 5"  (1.651 m), weight 118.5 kg, SpO2 97 %.  Intake/Output Summary (Last 24 hours) at 12/23/2017 1603 Last data filed at 12/23/2017 1300 Gross per 24 hour  Intake 721.47 ml  Output 600 ml  Net 121.47 ml   Filed Weights   12/23/17 0113 12/23/17 0311  Weight: 120.7 kg 118.5 kg    Examination: Pt was seen for a f/u visit.    CBC: Recent Labs  Lab 12/23/17 0034 12/23/17 0439  WBC 8.9 8.4  HGB 14.4 13.8  HCT 42.9 41.1  MCV 92.9 91.5  PLT 235 235   Basic Metabolic Panel: Recent Labs  Lab 12/23/17 0034 12/23/17 0933  NA 141  --   K 3.6  --   CL 108  --   CO2 22  --   GLUCOSE 114*  --   BUN 19  --   CREATININE 0.82  --   CALCIUM 9.0  --   MG  --  2.0   GFR: Estimated Creatinine Clearance: 91.6 mL/min (by C-G formula based on SCr of 0.82 mg/dL).  Liver Function Tests: Recent Labs  Lab 12/23/17 0933  AST 26  ALT 21  ALKPHOS 65  BILITOT 0.8  PROT 6.2*  ALBUMIN 3.4*    Cardiac Enzymes: Recent Labs  Lab 12/23/17 0439 12/23/17 0933  TROPONINI 1.21* 2.02*    HbA1C: Hgb A1c MFr Bld  Date/Time Value Ref Range  Status  12/23/2017 09:32 AM 5.2 4.8 - 5.6 % Final    Comment:    (NOTE) Pre diabetes:          5.7%-6.4% Diabetes:              >6.4% Glycemic control for   <7.0% adults with diabetes     Scheduled Meds: . [START ON 12/24/2017] aspirin EC  81 mg Oral Daily  . atorvastatin  80 mg Oral q1800  . metoprolol tartrate  12.5 mg Oral BID  . nicotine  21 mg Transdermal Daily   Continuous Infusions: . heparin 1,450 Units/hr (12/23/17 1342)     LOS: 0 days   Time spent: No Charge  Lonia BloodJeffrey T. McClung, MD [facility] Office  [phone] Pager - Text Page per Amion as per below:  On-Call/Text Page:      [email or web address]  If 7PM-7AM, please contact night-coverage [email or web address] 12/23/2017, 4:03 PM

## 2017-12-23 NOTE — Progress Notes (Addendum)
[facility]. CHG complete. CCMD notified. Vitals and assessment complete. Pt set up in bed oriented to room and call bell system. Will continue to monitor.

## 2017-12-24 ENCOUNTER — Encounter (HOSPITAL_COMMUNITY): Payer: Self-pay | Admitting: General Practice

## 2017-12-24 ENCOUNTER — Encounter (HOSPITAL_COMMUNITY)
Admission: EM | Disposition: A | Discharge status: Home / Self Care | Payer: Self-pay | Source: Home / Self Care | Attending: Internal Medicine

## 2017-12-24 HISTORY — PX: LEFT HEART CATH AND CORONARY ANGIOGRAPHY: CATH118249

## 2017-12-24 HISTORY — PX: CORONARY STENT PLACEMENT: SHX1402

## 2017-12-24 HISTORY — PX: CORONARY STENT INTERVENTION: CATH118234

## 2017-12-24 LAB — CBC
HCT: 40.3 % (ref 36.0–46.0)
Hemoglobin: 13.6 g/dL (ref 12.0–15.0)
MCH: 31.6 pg (ref 26.0–34.0)
MCHC: 33.7 g/dL (ref 30.0–36.0)
MCV: 93.5 fL (ref 80.0–100.0)
Platelets: 245 10*3/uL (ref 150–400)
RBC: 4.31 MIL/uL (ref 3.87–5.11)
RDW: 12.8 % (ref 11.5–15.5)
WBC: 7.8 10*3/uL (ref 4.0–10.5)
nRBC: 0 % (ref 0.0–0.2)

## 2017-12-24 LAB — LIPID PANEL
Cholesterol: 208 mg/dL — ABNORMAL HIGH (ref 0–200)
HDL: 50 mg/dL (ref >40)
LDL Cholesterol: 133 mg/dL — ABNORMAL HIGH (ref 0–99)
Total CHOL/HDL Ratio: 4.2 RATIO
Triglycerides: 124 mg/dL (ref <150)
VLDL: 25 mg/dL (ref 0–40)

## 2017-12-24 LAB — RAPID URINE DRUG SCREEN, HOSP PERFORMED
Amphetamines: NOT DETECTED
Barbiturates: NOT DETECTED
Benzodiazepines: NOT DETECTED
Cocaine: NOT DETECTED
Opiates: NOT DETECTED
Tetrahydrocannabinol: NOT DETECTED

## 2017-12-24 LAB — BASIC METABOLIC PANEL
Anion gap: 11 (ref 5–15)
BUN: 13 mg/dL (ref 8–23)
CHLORIDE: 107 mmol/L (ref 98–111)
CO2: 23 mmol/L (ref 22–32)
Calcium: 8.8 mg/dL — ABNORMAL LOW (ref 8.9–10.3)
Creatinine, Ser: 0.8 mg/dL (ref 0.44–1.00)
GFR calc Af Amer: >60 mL/min (ref >60)
GFR calc non Af Amer: >60 mL/min (ref >60)
Glucose, Bld: 103 mg/dL — ABNORMAL HIGH (ref 70–99)
Potassium: 3.8 mmol/L (ref 3.5–5.1)
SODIUM: 141 mmol/L (ref 135–145)

## 2017-12-24 LAB — HEPARIN LEVEL (UNFRACTIONATED): Heparin Unfractionated: 0.36 IU/mL (ref 0.30–0.70)

## 2017-12-24 LAB — POCT ACTIVATED CLOTTING TIME: Activated Clotting Time: 533 seconds

## 2017-12-24 LAB — PROTIME-INR
INR: 0.99
PROTHROMBIN TIME: 13 s (ref 11.4–15.2)

## 2017-12-24 SURGERY — LEFT HEART CATH AND CORONARY ANGIOGRAPHY
Anesthesia: LOCAL

## 2017-12-24 MED ORDER — HEPARIN (PORCINE) IN NACL 1000-0.9 UT/500ML-% IV SOLN
INTRAVENOUS | Status: DC | PRN
  Administered 2017-12-24 (×2): 500 mL

## 2017-12-24 MED ORDER — IOHEXOL 350 MG/ML SOLN
INTRAVENOUS | Status: DC | PRN
  Administered 2017-12-24: 70 mL via INTRAVENOUS

## 2017-12-24 MED ORDER — HEART ATTACK BOUNCING BOOK
Freq: Once | Status: AC
  Administered 2017-12-25: 07:00:00
  Filled 2017-12-24: qty 1

## 2017-12-24 MED ORDER — ATORVASTATIN CALCIUM 80 MG PO TABS: 80.0000 mg | ORAL_TABLET | Freq: Every day | ORAL | Status: DC

## 2017-12-24 MED ORDER — BIVALIRUDIN TRIFLUOROACETATE 250 MG IV SOLR
INTRAVENOUS | Status: AC
  Filled 2017-12-24: qty 250

## 2017-12-24 MED ORDER — ANGIOPLASTY BOOK
Freq: Once | Status: AC
  Administered 2017-12-25: 07:00:00
  Filled 2017-12-24: qty 1

## 2017-12-24 MED ORDER — FENTANYL CITRATE (PF) 100 MCG/2ML IJ SOLN
INTRAMUSCULAR | Status: AC
  Filled 2017-12-24: qty 2

## 2017-12-24 MED ORDER — LIDOCAINE HCL (PF) 1 % IJ SOLN
INTRAMUSCULAR | Status: DC | PRN
  Administered 2017-12-24: 2 mL via INTRADERMAL

## 2017-12-24 MED ORDER — TICAGRELOR 90 MG PO TABS
ORAL_TABLET | ORAL | Status: AC
  Filled 2017-12-24: qty 2

## 2017-12-24 MED ORDER — NITROGLYCERIN IN D5W 200-5 MCG/ML-% IV SOLN
INTRAVENOUS | Status: AC
  Filled 2017-12-24: qty 250

## 2017-12-24 MED ORDER — ONDANSETRON HCL 4 MG/2ML IJ SOLN: 4.0000 mg | Freq: Four times a day (QID) | INTRAMUSCULAR | Status: DC | PRN

## 2017-12-24 MED ORDER — SODIUM CHLORIDE 0.9 % IV SOLN
INTRAVENOUS | Status: AC
  Administered 2017-12-24: 11:00:00 via INTRAVENOUS

## 2017-12-24 MED ORDER — FENTANYL CITRATE (PF) 100 MCG/2ML IJ SOLN
INTRAMUSCULAR | Status: DC | PRN
  Administered 2017-12-24 (×2): 25 ug via INTRAVENOUS

## 2017-12-24 MED ORDER — THE SENSUOUS HEART BOOK
Freq: Once | Status: AC
  Administered 2017-12-25: 07:00:00
  Filled 2017-12-24: qty 1

## 2017-12-24 MED ORDER — VERAPAMIL HCL 2.5 MG/ML IV SOLN
INTRA_ARTERIAL | Status: DC | PRN
  Administered 2017-12-24: 10 mL via INTRA_ARTERIAL

## 2017-12-24 MED ORDER — VERAPAMIL HCL 2.5 MG/ML IV SOLN
INTRAVENOUS | Status: AC
  Filled 2017-12-24: qty 2

## 2017-12-24 MED ORDER — HYDRALAZINE HCL 20 MG/ML IJ SOLN: 5.0000 mg | INTRAMUSCULAR | Status: AC | PRN

## 2017-12-24 MED ORDER — SODIUM CHLORIDE 0.9 % IV SOLN: 250.0000 mL | INTRAVENOUS | Status: DC | PRN

## 2017-12-24 MED ORDER — HEPARIN (PORCINE) IN NACL 1000-0.9 UT/500ML-% IV SOLN
INTRAVENOUS | Status: AC
  Filled 2017-12-24: qty 500

## 2017-12-24 MED ORDER — MIDAZOLAM HCL 2 MG/2ML IJ SOLN
INTRAMUSCULAR | Status: AC
  Filled 2017-12-24: qty 2

## 2017-12-24 MED ORDER — SODIUM CHLORIDE 0.9% FLUSH: 3.0000 mL | INTRAVENOUS | Status: DC | PRN

## 2017-12-24 MED ORDER — NITROGLYCERIN 1 MG/10 ML FOR IR/CATH LAB
INTRA_ARTERIAL | Status: AC
  Filled 2017-12-24: qty 10

## 2017-12-24 MED ORDER — MIDAZOLAM HCL 2 MG/2ML IJ SOLN
INTRAMUSCULAR | Status: DC | PRN
  Administered 2017-12-24 (×2): 1 mg via INTRAVENOUS

## 2017-12-24 MED ORDER — BIVALIRUDIN BOLUS VIA INFUSION - CUPID
INTRAVENOUS | Status: DC | PRN
  Administered 2017-12-24: 88.575 mg via INTRAVENOUS

## 2017-12-24 MED ORDER — TICAGRELOR 90 MG PO TABS
90.0000 mg | ORAL_TABLET | Freq: Two times a day (BID) | ORAL | Status: DC
  Administered 2017-12-24 – 2017-12-25 (×2): 90 mg via ORAL
  Filled 2017-12-24 (×2): qty 1

## 2017-12-24 MED ORDER — NITROGLYCERIN IN D5W 200-5 MCG/ML-% IV SOLN
INTRAVENOUS | Status: AC | PRN
  Administered 2017-12-24: 10 ug/min via INTRAVENOUS

## 2017-12-24 MED ORDER — MORPHINE SULFATE (PF) 2 MG/ML IV SOLN: 2.0000 mg | INTRAVENOUS | Status: DC | PRN

## 2017-12-24 MED ORDER — HEPARIN SODIUM (PORCINE) 1000 UNIT/ML IJ SOLN
INTRAMUSCULAR | Status: AC
  Filled 2017-12-24: qty 1

## 2017-12-24 MED ORDER — ASPIRIN 81 MG PO CHEW: 81.0000 mg | CHEWABLE_TABLET | Freq: Every day | ORAL | Status: DC

## 2017-12-24 MED ORDER — TICAGRELOR 90 MG PO TABS
ORAL_TABLET | ORAL | Status: DC | PRN
  Administered 2017-12-24: 180 mg via ORAL

## 2017-12-24 MED ORDER — HEPARIN SODIUM (PORCINE) 1000 UNIT/ML IJ SOLN
INTRAMUSCULAR | Status: DC | PRN
  Administered 2017-12-24: 5500 [IU] via INTRAVENOUS

## 2017-12-24 MED ORDER — SODIUM CHLORIDE 0.9% FLUSH
3.0000 mL | Freq: Two times a day (BID) | INTRAVENOUS | Status: DC
  Administered 2017-12-24: 22:00:00 3 mL via INTRAVENOUS

## 2017-12-24 MED ORDER — ACETAMINOPHEN 325 MG PO TABS
650.0000 mg | ORAL_TABLET | ORAL | Status: DC | PRN
  Administered 2017-12-24 (×2): 650 mg via ORAL
  Filled 2017-12-24 (×2): qty 2

## 2017-12-24 MED ORDER — SODIUM CHLORIDE 0.9 % IV SOLN
INTRAVENOUS | Status: AC | PRN
  Administered 2017-12-24 (×2): 1.75 mg/kg/h via INTRAVENOUS

## 2017-12-24 MED ORDER — LIDOCAINE HCL (PF) 1 % IJ SOLN
INTRAMUSCULAR | Status: AC
  Filled 2017-12-24: qty 30

## 2017-12-24 MED ORDER — SODIUM CHLORIDE 0.9 % IV SOLN
1.7500 mg/kg/h | INTRAVENOUS | Status: AC
  Administered 2017-12-24: 1.75 mg/kg/h via INTRAVENOUS
  Filled 2017-12-24 (×3): qty 250

## 2017-12-24 MED ORDER — LABETALOL HCL 5 MG/ML IV SOLN: 10.0000 mg | INTRAVENOUS | Status: AC | PRN

## 2017-12-24 SURGICAL SUPPLY — 17 items
BALLN SAPPHIRE 2.0X12 (BALLOONS) ×2
BALLN SAPPHIRE ~~LOC~~ 3.25X15 (BALLOONS) ×2 IMPLANT
BALLOON SAPPHIRE 2.0X12 (BALLOONS) ×1 IMPLANT
CATH OPTITORQUE TIG 4.0 5F (CATHETERS) ×2 IMPLANT
CATH VISTA GUIDE 6FR XBLAD3.5 (CATHETERS) ×2 IMPLANT
DEVICE RAD COMP TR BAND LRG (VASCULAR PRODUCTS) ×2 IMPLANT
GLIDESHEATH SLEND A-KIT 6F 22G (SHEATH) ×2 IMPLANT
GUIDEWIRE INQWIRE 1.5J.035X260 (WIRE) ×1 IMPLANT
INQWIRE 1.5J .035X260CM (WIRE) ×2
KIT ENCORE 26 ADVANTAGE (KITS) ×2 IMPLANT
KIT HEART LEFT (KITS) ×2 IMPLANT
PACK CARDIAC CATHETERIZATION (CUSTOM PROCEDURE TRAY) ×2 IMPLANT
STENT SYNERGY DES 3X20 (Permanent Stent) ×2 IMPLANT
TRANSDUCER W/STOPCOCK (MISCELLANEOUS) ×2 IMPLANT
TUBING CIL FLEX 10 FLL-RA (TUBING) ×2 IMPLANT
WIRE ASAHI PROWATER 180CM (WIRE) ×2 IMPLANT
WIRE HI TORQ VERSACORE-J 145CM (WIRE) ×2 IMPLANT

## 2017-12-24 NOTE — Care Management (Signed)
#    10  S/W    NADINE  @ [employer] # [phone]  OPT- 2  BRILINTA  90 MG BID COVER- YES CO-PAY- $ 40.00 TIER- NO PRIOR APPROVAL- NO  PREFERRED PHARMACY : YES [place] [facility]

## 2017-12-24 NOTE — Progress Notes (Signed)
[place] TEAM 1 - Stepdown/ICU TEAM  Gina ReelFrances Morris  [id number] DOB: 05-Dec-1955 DOA: 12/22/2017 PCP: Patient, No Pcp Per    Brief Narrative:  62yo F w/ a hx of tobacco abuse and obesity who presented w/ chest pain described as presure.  In the ED labs were significant for troponin of 0.25.  Chest x-ray showed no acute abnormalities.    Significant Events: 12/22 admit   Subjective: Resting comfortably in bed post-cath. No complaints. No chest pain. No wrist pain.   Assessment & Plan:  NSTEMI S/p cardiac cath w/ PTCA/DES to 90% circumflex stenosis - post-cath care per Cards  Tobacco abuse Has been counseled on absolute need to stop smoking  Elevated BP BP improved now   HLD Cont lipitor   Morbid obesity - Body mass index is 43.32 kg/m.   DVT prophylaxis: SCDs Code Status: FULL CODE Family Communication:  Disposition Plan: per Cards  Consultants:  Cardiology   Antimicrobials:  none   Objective: Morris pressure 135/81, pulse (!) 58, temperature 98.3 F (36.8 C), temperature source Oral, resp. rate 18, height 5\' 5"  (1.651 m), weight 118.1 kg, SpO2 97 %.  Intake/Output Summary (Last 24 hours) at 12/24/2017 1731 Last data filed at 12/24/2017 1400 Gross per 24 hour  Intake 1721.29 ml  Output 1575 ml  Net 146.29 ml   Filed Weights   12/23/17 0113 12/23/17 0311 12/24/17 0440  Weight: 120.7 kg 118.5 kg 118.1 kg    Examination: No acute resp distress - alert and oriented   CBC: Recent Labs  Lab 12/23/17 0034 12/23/17 0439 12/24/17 0325  WBC 8.9 8.4 7.8  HGB 14.4 13.8 13.6  HCT 42.9 41.1 40.3  MCV 92.9 91.5 93.5  PLT 235 235 245   Basic Metabolic Panel: Recent Labs  Lab 12/23/17 0034 12/23/17 0933 12/24/17 0325  NA 141  --  141  K 3.6  --  3.8  CL 108  --  107  CO2 22  --  23  GLUCOSE 114*  --  103*  BUN 19  --  13  CREATININE 0.82  --  0.80  CALCIUM 9.0  --  8.8*  MG  --  2.0  --    GFR: Estimated Creatinine Clearance: 93.7  mL/min (by C-G formula based on SCr of 0.8 mg/dL).  Liver Function Tests: Recent Labs  Lab 12/23/17 0933  AST 26  ALT 21  ALKPHOS 65  BILITOT 0.8  PROT 6.2*  ALBUMIN 3.4*    Cardiac Enzymes: Recent Labs  Lab 12/23/17 0439 12/23/17 0933 12/23/17 1641  TROPONINI 1.21* 2.02* 1.76*    HbA1C: Hgb A1c MFr Bld  Date/Time Value Ref Range Status  12/23/2017 09:32 AM 5.2 4.8 - 5.6 % Final    Comment:    (NOTE) Pre diabetes:          5.7%-6.4% Diabetes:              >6.4% Glycemic control for   <7.0% adults with diabetes     Scheduled Meds: . aspirin EC  81 mg Oral Daily  . atorvastatin  80 mg Oral q1800  . metoprolol tartrate  12.5 mg Oral BID  . nicotine  21 mg Transdermal Daily  . sodium chloride flush  3 mL Intravenous Q12H  . ticagrelor  90 mg Oral BID     LOS: 1 day    Gina BloodJeffrey T. Bracen Schum, MD [facility] Office  [phone] Pager - Text Page per Loretha StaplerAmion as per below:  On-Call/Text Page:  [email or web address]  If 7PM-7AM, please contact night-coverage [email or web address] 12/24/2017, 5:31 PM

## 2017-12-24 NOTE — Interval H&P Note (Signed)
Cath Lab Visit (complete for each Cath Lab visit)  Clinical Evaluation Leading to the Procedure:   ACS: Yes.    Non-ACS:    Anginal Classification: CCS III  Anti-ischemic medical therapy: Minimal Therapy (1 class of medications)  Non-Invasive Test Results: No non-invasive testing performed  Prior CABG: No previous CABG      History and Physical Interval Note:  12/24/2017 7:39 AM  Gina Morris  has presented today for surgery, with the diagnosis of NSTEMI  The various methods of treatment have been discussed with the patient and family. After consideration of risks, benefits and other options for treatment, the patient has consented to  Procedure(s): LEFT HEART CATH AND CORONARY ANGIOGRAPHY (N/A) as a surgical intervention .  The patient's history has been reviewed, patient examined, no change in status, stable for surgery.  I have reviewed the patient's chart and labs.  Questions were answered to the patient's satisfaction.     Nanetta BattyJonathan Berry

## 2017-12-24 NOTE — Care Management Note (Addendum)
Case Management Note  Patient Details  Name: Gina Morris MRN: [id number] Date of Birth: March 18, 1955  Subjective/Objective:    From home, s/p stent intervention, will be on brilinta, no insurance listed, [facility], will give to admissions, and sent to Verlee MonteDora for benefit check for brilinta. Patient does not have a PCP,  NCM asked if she would like assistance in getting a PCP, she states she would like to get the PCP her self.  NCM awaiting benefit check information.  NCM informed [name] RN of copay of 40.00 she will let patient know information.                  Action/Plan: NCM will follow for transition of care needs.   Expected Discharge Date:  12/25/17               Expected Discharge Plan:  Home/Self Care  In-House Referral:     Discharge planning Services  CM Consult  Post Acute Care Choice:    Choice offered to:     DME Arranged:    DME Agency:     HH Arranged:    HH Agency:     Status of Service:  In process, will continue to follow  If discussed at Long Length of Stay Meetings, dates discussed:    Additional Comments:  [name], [name], RN 12/24/2017, 3:37 PM

## 2017-12-24 NOTE — Progress Notes (Signed)
TR BAND REMOVAL  LOCATION:  right radial  DEFLATED PER PROTOCOL:  Yes.    TIME BAND OFF / DRESSING APPLIED:   1730   SITE UPON ARRIVAL:   Level 0  SITE AFTER BAND REMOVAL:  Level 0  CIRCULATION SENSATION AND MOVEMENT:  Within Normal Limits  Yes.    COMMENTS:    

## 2017-12-25 DIAGNOSIS — Z720 Tobacco use: Secondary | ICD-10-CM

## 2017-12-25 DIAGNOSIS — E785 Hyperlipidemia, unspecified: Secondary | ICD-10-CM

## 2017-12-25 DIAGNOSIS — I10 Essential (primary) hypertension: Secondary | ICD-10-CM

## 2017-12-25 LAB — BASIC METABOLIC PANEL
Anion gap: 10 (ref 5–15)
BUN: 10 mg/dL (ref 8–23)
CHLORIDE: 109 mmol/L (ref 98–111)
CO2: 22 mmol/L (ref 22–32)
CREATININE: 0.8 mg/dL (ref 0.44–1.00)
Calcium: 8.9 mg/dL (ref 8.9–10.3)
GFR calc Af Amer: >60 mL/min (ref >60)
GFR calc non Af Amer: >60 mL/min (ref >60)
Glucose, Bld: 97 mg/dL (ref 70–99)
Potassium: 4.1 mmol/L (ref 3.5–5.1)
Sodium: 141 mmol/L (ref 135–145)

## 2017-12-25 LAB — CBC
HCT: 40.3 % (ref 36.0–46.0)
Hemoglobin: 13.8 g/dL (ref 12.0–15.0)
MCH: 31.6 pg (ref 26.0–34.0)
MCHC: 34.2 g/dL (ref 30.0–36.0)
MCV: 92.2 fL (ref 80.0–100.0)
Platelets: 244 10*3/uL (ref 150–400)
RBC: 4.37 MIL/uL (ref 3.87–5.11)
RDW: 12.6 % (ref 11.5–15.5)
WBC: 7.1 10*3/uL (ref 4.0–10.5)
nRBC: 0 % (ref 0.0–0.2)

## 2017-12-25 MED ORDER — ATORVASTATIN CALCIUM 80 MG PO TABS: 80.0000 mg | ORAL_TABLET | Freq: Every day | ORAL | 0 refills | Status: DC

## 2017-12-25 MED ORDER — METOPROLOL TARTRATE 25 MG PO TABS
25.0000 mg | ORAL_TABLET | Freq: Two times a day (BID) | ORAL | Status: DC
  Administered 2017-12-25: 25 mg via ORAL
  Filled 2017-12-25: qty 1

## 2017-12-25 MED ORDER — NICOTINE 14 MG/24HR TD PT24: 14.0000 mg | MEDICATED_PATCH | Freq: Every day | TRANSDERMAL | 0 refills | Status: DC

## 2017-12-25 MED ORDER — NICOTINE 21 MG/24HR TD PT24: 21.0000 mg | MEDICATED_PATCH | Freq: Every day | TRANSDERMAL | 0 refills | Status: DC

## 2017-12-25 MED ORDER — NITROGLYCERIN 0.4 MG SL SUBL: 0.4000 mg | SUBLINGUAL_TABLET | SUBLINGUAL | 0 refills | Status: DC | PRN

## 2017-12-25 MED ORDER — NICOTINE 7 MG/24HR TD PT24: 7.0000 mg | MEDICATED_PATCH | Freq: Every day | TRANSDERMAL | 0 refills | Status: DC

## 2017-12-25 MED ORDER — TICAGRELOR 90 MG PO TABS: 90.0000 mg | ORAL_TABLET | Freq: Two times a day (BID) | ORAL | 0 refills | Status: DC

## 2017-12-25 MED ORDER — METOPROLOL TARTRATE 25 MG PO TABS: 25.0000 mg | ORAL_TABLET | Freq: Two times a day (BID) | ORAL | 0 refills | Status: DC

## 2017-12-25 MED ORDER — ASPIRIN 81 MG PO TBEC: 81.0000 mg | DELAYED_RELEASE_TABLET | Freq: Every day | ORAL | 0 refills | Status: DC

## 2017-12-25 MED FILL — ATORVASTATIN CALCIUM 80 MG: 80 | 30 days supply | Qty: 30 | Fill #0

## 2017-12-25 MED FILL — BRILINTA 90 MG TABLET: 90 | 30 days supply | Qty: 60 | Fill #0

## 2017-12-25 MED FILL — ASPIRIN LOW DOSE 81 MG TBEC: 81 | 30 days supply | Qty: 30 | Fill #0

## 2017-12-25 MED FILL — METOPROLOL TARTRATE 25 MG T: 25 | 30 days supply | Qty: 60 | Fill #0

## 2017-12-25 MED FILL — NITROGLYCERIN 0.4 MG TAB SL: 0.4 | 8 days supply | Qty: 25 | Fill #0

## 2017-12-25 MED FILL — NICOTINE 21 MG/24HR PATCH: 21 | 14 days supply | Qty: 14 | Fill #0

## 2017-12-25 NOTE — Discharge Summary (Addendum)
Physician Discharge Summary  Gina Morris ZOX:[id number] DOB: [birth year] DOA: 12/22/2017  PCP: Patient, No Pcp Per  Admit date: 12/22/2017 Discharge date: 12/25/2017  Admitted From:home Disposition:  home  Recommendations for Outpatient Follow-up:  1. Advised to find a PCP 2. Continue to encourage smoking cessation and weight loss 3. F/u Lipids levels      Discharge Condition: stable CODE STATUS:  Full code   Diet recommendation:  Heart healthy Consultations:  Cardiology     Discharge Diagnoses:  Principal Problem:   NSTEMI (non-ST elevated myocardial infarction) (HCC) Active Problems:   HLD (hyperlipidemia)   Tobacco abuse   HTN   Brief Summary: Gina Morris is a 62 y.o. female with no PCP and  tobacco abuse and obesity who presents with complaints of chest pain radiating to the back and to her teeth. Troponin noted to go from 0.25 to 1.21. Cardiology consulted and recommended cardiac cath.   Hospital Course:  NSTEMI - s/p cath on 12/23 - mCX 90% stenosed s/p PCI and DES - cardiology recommendations: Continue ASA (81 mg), Brillinta, Metoprolol and statin. - she needs to quit smoking  HTN - cont Metoprolol  Nicotine abuse - she has been counseled and I have written for Nicotine patches  HLD - see Lipid panel below - cont statin and f/u outpt labs  GERD? - OTC Pepcid or Zantac for now  Morbid obesity Body mass index is 43.03 kg/m.   Discharge Exam: Vitals:   12/25/17 0725 12/25/17 1049  BP: (!) 135/98 (!) 129/96  Pulse: 82 68  Resp: 16 18  Temp: 98.5 F (36.9 C)   SpO2: 96%    Vitals:   12/25/17 0415 12/25/17 0420 12/25/17 0725 12/25/17 1049  BP:  127/83 (!) 135/98 (!) 129/96  Pulse:  67 82 68  Resp: 18 (!) 23 16 18   Temp:  98.6 F (37 C) 98.5 F (36.9 C)   TempSrc:  Oral Oral   SpO2:   96%   Weight:  117.3 kg    Height:        General: Pt is alert, awake, not in acute distress Cardiovascular: RRR, S1/S2 +, no rubs, no  gallops Respiratory: CTA bilaterally, no wheezing, no rhonchi Abdominal: Soft, NT, ND, bowel sounds + Extremities: no edema, no cyanosis   Discharge Instructions  Discharge Instructions    AMB Referral to Cardiac Rehabilitation - Phase II   Complete by:  As directed    Diagnosis:  NSTEMI   Diet - low sodium heart healthy   Complete by:  As directed    Increase activity slowly   Complete by:  As directed      Allergies as of 12/25/2017      Reactions   Penicillins Other (See Comments)   Unknown reaction  DID THE REACTION INVOLVE: Swelling of the face/tongue/throat, SOB, or low BP? Unknown Sudden or severe rash/hives, skin peeling, or the inside of the mouth or nose? Unknown Did it require medical treatment? Unknown When did it last happen? If all above answers are "NO", may proceed with cephalosporin use.      Medication List    STOP taking these medications   PROBIOTIC ACIDOPHILUS Caps     TAKE these medications   aspirin 81 MG EC tablet Take 1 tablet (81 mg total) by mouth daily. What changed:    medication strength  how much to take  when to take this  reasons to take this   atorvastatin 80 MG tablet Commonly known as:  LIPITOR Take 1 tablet (80 mg total) by mouth daily at 6 PM.   metoprolol tartrate 25 MG tablet Commonly known as:  LOPRESSOR Take 1 tablet (25 mg total) by mouth 2 (two) times daily.   nicotine 21 mg/24hr patch Commonly known as:  NICODERM CQ - dosed in mg/24 hours Place 1 patch (21 mg total) onto the skin daily.   nicotine 14 mg/24hr patch Commonly known as:  NICODERM CQ Place 1 patch (14 mg total) onto the skin daily. Start when the 21 mg patches are finished   nicotine 7 mg/24hr patch Commonly known as:  NICODERM CQ Place 1 patch (7 mg total) onto the skin daily. Start after 14 mg patches are finished.   nitroGLYCERIN 0.4 MG SL tablet Commonly known as:  NITROSTAT Place 1 tablet (0.4 mg total) under the tongue every 5  (five) minutes as needed for chest pain.   ticagrelor 90 MG Tabs tablet Commonly known as:  BRILINTA Take 1 tablet (90 mg total) by mouth 2 (two) times daily.      Follow-up Information    Wendall StadeNishan, Peter C, MD. Go on 01/08/2018.   Specialty:  Cardiology Why:  @3pm  for hosptial follow up. Please arrive 15 minutes early  Contact information: [address] [place] [address] [phone]          Allergies  Allergen Reactions  . Penicillins Other (See Comments)    Unknown reaction  DID THE REACTION INVOLVE: Swelling of the face/tongue/throat, SOB, or low BP? Unknown Sudden or severe rash/hives, skin peeling, or the inside of the mouth or nose? Unknown Did it require medical treatment? Unknown When did it last happen? If all above answers are "NO", may proceed with cephalosporin use.      Procedures/Studies: Cardiac cath  Dg Chest 2 View  Result Date: 12/23/2017 CLINICAL DATA:  Acute onset of central chest pain, radiating to the back and jaw. Nausea and vomiting. EXAM: CHEST - 2 VIEW COMPARISON:  CT of the chest performed 12/23/2013 FINDINGS: The lungs are well-aerated. Pulmonary vascularity is at the upper limits of normal. There is no evidence of focal opacification, pleural effusion or pneumothorax. The heart is normal in size; the mediastinal contour is within normal limits. No acute osseous abnormalities are seen. IMPRESSION: No acute cardiopulmonary process seen. Electronically Signed   By: Roanna RaiderJeffery  Chang M.D.   On: 12/23/2017 00:23      The results of significant diagnostics from this hospitalization (including imaging, microbiology, ancillary and laboratory) are listed below for reference.     Microbiology: No results found for this or any previous visit (from the past 240 hour(s)).   Labs: BNP (last 3 results) No results for input(s): BNP in the last 8760 hours. Basic Metabolic Panel: Recent Labs  Lab 12/23/17 0034 12/23/17 0933  12/24/17 0325 12/25/17 0419  NA 141  --  141 141  K 3.6  --  3.8 4.1  CL 108  --  107 109  CO2 22  --  23 22  GLUCOSE 114*  --  103* 97  BUN 19  --  13 10  CREATININE 0.82  --  0.80 0.80  CALCIUM 9.0  --  8.8* 8.9  MG  --  2.0  --   --    Liver Function Tests: Recent Labs  Lab 12/23/17 0933  AST 26  ALT 21  ALKPHOS 65  BILITOT 0.8  PROT 6.2*  ALBUMIN 3.4*   No results for input(s): LIPASE,  AMYLASE in the last 168 hours. No results for input(s): AMMONIA in the last 168 hours. CBC: Recent Labs  Lab 12/23/17 0034 12/23/17 0439 12/24/17 0325 12/25/17 0419  WBC 8.9 8.4 7.8 7.1  HGB 14.4 13.8 13.6 13.8  HCT 42.9 41.1 40.3 40.3  MCV 92.9 91.5 93.5 92.2  PLT 235 235 245 244   Cardiac Enzymes: Recent Labs  Lab 12/23/17 0439 12/23/17 0933 12/23/17 1641  TROPONINI 1.21* 2.02* 1.76*   BNP: Invalid input(s): POCBNP CBG: No results for input(s): GLUCAP in the last 168 hours. D-Dimer Recent Labs    12/23/17 0439  DDIMER 0.46   Hgb A1c Recent Labs    12/23/17 0932  HGBA1C 5.2   Lipid Profile Recent Labs    12/24/17 0325  CHOL 208*  HDL 50  LDLCALC 133*  TRIG 124  CHOLHDL 4.2   Thyroid function studies Recent Labs    12/23/17 0932  TSH 2.776   Anemia work up No results for input(s): VITAMINB12, FOLATE, FERRITIN, TIBC, IRON, RETICCTPCT in the last 72 hours. Urinalysis No results found for: COLORURINE, APPEARANCEUR, LABSPEC, PHURINE, GLUCOSEU, HGBUR, BILIRUBINUR, KETONESUR, PROTEINUR, UROBILINOGEN, NITRITE, LEUKOCYTESUR Sepsis Labs Invalid input(s): PROCALCITONIN,  WBC,  LACTICIDVEN Microbiology No results found for this or any previous visit (from the past 240 hour(s)).   Time coordinating discharge in minutes: 60  SIGNED:   Calvert CantorSaima Dathan Attia, MD  [facility] 12/25/2017, 10:53 AM Pager   If 7PM-7AM, please contact night-coverage [email or web address] Password [email or web address]

## 2017-12-25 NOTE — Progress Notes (Addendum)
Progress Note  Patient Name: Gina Morris Date of Encounter: 12/25/2017  Primary Cardiologist: New to Dr. Eden EmmsNishan   Subjective   Mild dyspnea after taking brillinta. Has GERD type symptoms. Cannot take PPI due to headache. She will try OTC Zantac or pepcid.  Had some issues with her right forearm IV infiltrating last night was firm and tender.  Feels better today.  Also mild swelling in the right hand.  Also better. Thankfully, has not had any chest pain or pressure.  Inpatient Medications    Scheduled Meds: . aspirin EC  81 mg Oral Daily  . atorvastatin  80 mg Oral q1800  . metoprolol tartrate  25 mg Oral BID  . nicotine  21 mg Transdermal Daily  . sodium chloride flush  3 mL Intravenous Q12H  . ticagrelor  90 mg Oral BID   Continuous Infusions: . sodium chloride     PRN Meds: sodium chloride, acetaminophen, morphine injection, nitroGLYCERIN, ondansetron (ZOFRAN) IV, sodium chloride flush   Vital Signs    Vitals:   12/24/17 2007 12/25/17 0415 12/25/17 0420 12/25/17 0725  BP: (!) 121/95  127/83 (!) 135/98  Pulse: 64  67 82  Resp: 18 18 (!) 23 16  Temp: (!) 97.4 F (36.3 C)  98.6 F (37 C) 98.5 F (36.9 C)  TempSrc: Oral  Oral Oral  SpO2: 95%   96%  Weight:   117.3 kg   Height:        Intake/Output Summary (Last 24 hours) at 12/25/2017 0752 Last data filed at 12/24/2017 2013 Gross per 24 hour  Intake 720 ml  Output 1225 ml  Net -505 ml   Filed Weights   12/23/17 0311 12/24/17 0440 12/25/17 0420  Weight: 118.5 kg 118.1 kg 117.3 kg    Telemetry    SR at 50s - Personally Reviewed  ECG    SR with interior lateral TWI- Personally Reviewed  Physical Exam   GEN: No acute distress.   Neck: No JVD Cardiac: RRR, no murmurs, rubs, or gallops. Right radial without hematoma; mild swelling of the right hand and right elbow/forearm.  No ecchymosis Respiratory: Clear to auscultation bilaterally. GI: Soft, nontender, non-distended  MS: No edema; No  deformity. Neuro:  Nonfocal  Psych: Normal affect   Labs    Chemistry Recent Labs  Lab 12/23/17 0034 12/23/17 0933 12/24/17 0325 12/25/17 0419  NA 141  --  141 141  K 3.6  --  3.8 4.1  CL 108  --  107 109  CO2 22  --  23 22  GLUCOSE 114*  --  103* 97  BUN 19  --  13 10  CREATININE 0.82  --  0.80 0.80  CALCIUM 9.0  --  8.8* 8.9  PROT  --  6.2*  --   --   ALBUMIN  --  3.4*  --   --   AST  --  26  --   --   ALT  --  21  --   --   ALKPHOS  --  65  --   --   BILITOT  --  0.8  --   --   GFRNONAA >60  --  >60 >60  GFRAA >60  --  >60 >60  ANIONGAP 11  --  11 10     Hematology Recent Labs  Lab 12/23/17 0439 12/24/17 0325 12/25/17 0419  WBC 8.4 7.8 7.1  RBC 4.49 4.31 4.37  HGB 13.8 13.6 13.8  HCT 41.1  40.3 40.3  MCV 91.5 93.5 92.2  MCH 30.7 31.6 31.6  MCHC 33.6 33.7 34.2  RDW 12.5 12.8 12.6  PLT 235 245 244    Cardiac Enzymes Recent Labs  Lab 12/23/17 0439 12/23/17 0933 12/23/17 1641  TROPONINI 1.21* 2.02* 1.76*    Recent Labs  Lab 12/23/17 0038  TROPIPOC 0.25*     BNPNo results for input(s): BNP, PROBNP in the last 168 hours.   DDimer  Recent Labs  Lab 12/23/17 0439  DDIMER 0.46     Radiology    No results found.  Cardiac Studies   Echo 12/23/17 - Left ventricle: The cavity size was normal. Wall thickness was   increased in a pattern of mild LVH. Systolic function was   vigorous. The estimated ejection fraction was in the range of 65%   to 70%. Wall motion was normal; there were no regional wall   motion abnormalities. Doppler parameters are consistent with   abnormal left ventricular relaxation (grade 1 diastolic   dysfunction). The E/e&' ratio is <8, suggesting normal LV filling   pressure. - Mitral valve: Mildly thickened leaflets . There was mild   regurgitation. - Left atrium: The atrium was mildly dilated. - Right atrium: The atrium was mildly dilated. - Tricuspid valve: There was mild regurgitation. - Pulmonary arteries: PA  peak pressure: 27 mm Hg (S). - Inferior vena cava: The vessel was normal in size. The   respirophasic diameter changes were in the normal range (>= 50%),   consistent with normal central venous pressure.  Impressions:  - LVEF 65-70%, mild LVH, normal wall motion, grade 1 DD, normal LV   filling pressure, mild MR, mild biatrial enlargement, mild TR,   RVSP 27 mmHg, normal IVC.  CORONARY STENT INTERVENTION 12/24/17  LEFT HEART CATH AND CORONARY ANGIOGRAPHY  Conclusion     Prox Cx to Mid Cx lesion is 90% stenosed.  Ost LAD to Prox LAD lesion is 30% stenosed.  Prox LAD to Mid LAD lesion is 30% stenosed.  A drug-eluting stent was successfully placed.  Post intervention, there is a 0% residual stenosis.   IMPRESSION: Successful PCI and drug-eluting stenting of the mid AV groove circumflex with a synergy drug-eluting stent postdilated to 3.3 mm.  Angiomax will continue for 4 hours full dose.  Patient be gently hydrated and most likely discharge home in the morning.  She had minimal residual CAD in her other vessels with good LV function.  Cardiac resector modification should be emphasized including smoking cessation.  Patient Profile     *Gina Morris is a 62 y.o. female with a hx of tobacco use and obesity who is being seen  for the evaluation of chest pain, NSTEMI at the request of Dr. Sharon Seller.  Assessment & Plan    1. NSTEMI - troponin peaked at 2.02. Treated with heparin. Echo showed normal LVEF with grade 1 DD. Cath showed mCX 90% stenosed s/p PCI and DES. Otherwise minimal disease. No complications. Renal function stable. Continue ASA, Brillinta, BB and statin. Mild dyspnea after brillinta. Advised to take with caffeine. Will call us if no improvement. Ambulate. Okay to discharge if no recurrent pain.   2. HLD - 12/24/2017: Cholesterol 208; HDL 50; LDL Cholesterol 133; Triglycerides 124; VLDL 25  - LDL goal less than 70 - Consider OP f/u labs 6-8 weeks given statin  initiation this admission.  3. Tobacco abuse Smoking cessation recommended  4. Uncontrolled GERD - Per primary team  SignedManson Passey, Bhavinkumar Bhagat, PA  12/25/2017, 7:52 AM     I have seen, examined and evaluated the patient this AM along with Mr. Cathie OldenBhagat-PA.  After reviewing all the available data and chart, we discussed the patients laboratory, study & physical findings as well as symptoms in detail. I agree with his findings, examination as well as impression recommendations as per our discussion.    Attending adjustments noted in italics.   Overall doing well after non-STEMI admission with PCI to the circumflex.  We discussed the bleeding issues with Brilinta.  Seems to be better.  No recurrent chest pain. We will need to have outpatient lipids checked now that she is started on statin. She indicates that she is ready to quit smoking.  Does not intend to go back.  Spent at least 5 minutes discussing importance of smoking cessation.  Blood pressure currently stable on current dose of beta-blocker (have increased to 25 mg twice daily. We will follow-up blood pressure in outpatient follow-up.   Patient did ask about documentation for FMLA paperwork.  Defer to primary service.    [facility] will sign off.   Medication Recommendations:  ASA, Brillinta, BB, PRN nitro and statin  Other recommendations (labs, testing, etc): none Follow up as an outpatient:will schedule    Bryan Lemmaavid Marvyn Torrez, M.D., M.S. Interventional Cardiologist   Pager # [phone] Phone # [phone] [address] [place] [address]    For questions or updates, please contact [facility] Please consult [email or web address] for contact info under

## 2017-12-25 NOTE — Progress Notes (Signed)
CARDIAC REHAB PHASE I   PRE:  Rate/Rhythm: 71 SR  BP:  Sitting: 134/94      SaO2: 97 RA  MODE:  Ambulation: 1000 ft   POST:  Rate/Rhythm: 83 SR  BP:  Sitting: 128/84    SaO2: 96 RA   Pt ambulated 106700ft in hallway independently with quick steady gait. Pt denies CP or SOB, but does state she "felt" the walk. Pt encouraged to slow her pace. Pt and daughter educated on importance of ASA, Brilinta, statin, and NTG. Stent card and MI book at bedside. Pt given heart healthy diet and smoking cessation tip sheet. Reviewed restrictions and exercise guidelines. Pt anxious and processing recent events, support provided. Will refer to CRP II [place].   [id number] Reynold Boweneresa  Lanora Reveron, RN BSN 12/25/2017 9:34 AM

## 2017-12-27 ENCOUNTER — Telehealth (HOSPITAL_COMMUNITY): Payer: Self-pay

## 2017-12-27 NOTE — Telephone Encounter (Signed)
Pt insurance is active and benefits verified through [employer]. Co-pay $0.00, DED $600.00/$0.00 met, out of pocket $4,000.00/$182.08 met, co-insurance 20%. No pre-authorization required. Danita H./[employer], 12/27/17 @ 4:15PM, REF# [id number]  Will contact patient to see if she is interested in the Cardiac Rehab Program. If interested, patient will need to complete follow up appt. Once completed, patient will be contacted for scheduling upon review by the RN Navigator.

## 2018-01-07 NOTE — Progress Notes (Signed)
Cardiology Office Note   Date:  01/08/2018   ID:  Gina Morris, DOB July 28, 1955, MRN [id number]  PCP:  Gina Hila, MD  Cardiologist:   Charlton Haws, MD   No chief complaint on file.     History of Present Illness: Gina Morris is a 63 y.o. female who presents for f/u CAD.  Obese smoker with NSTEMI 12/25/17 Troponin peak 2.02. Cath with single vessel disease DES to mid AV circumflex. EF normal  Significant GERD, smoking and HLD on statin CXR 12/23/17 NAD  Lots of bruising from radial cath. No angina Wearing nicotine patch. Wants to start  Cardiac rehab. FMLA forms filled out for patient     Past Medical History:  Diagnosis Date  . Baker's cyst of knee, left   . Coronary artery disease   . Morbid obesity (HCC)   . Tobacco abuse     Past Surgical History:  Procedure Laterality Date  . CORONARY STENT INTERVENTION N/A 12/24/2017   Procedure: CORONARY STENT INTERVENTION;  Surgeon: Runell Gess, MD;  Location: [facility] INVASIVE [facility];  Service: Cardiovascular;  Laterality: N/A;  . CORONARY STENT PLACEMENT  12/24/2017  . LEFT HEART CATH AND CORONARY ANGIOGRAPHY N/A 12/24/2017   Procedure: LEFT HEART CATH AND CORONARY ANGIOGRAPHY;  Surgeon: Runell Gess, MD;  Location: [facility] INVASIVE CV LAB;  Service: Cardiovascular;  Laterality: N/A;  . TONSILLECTOMY       Current Outpatient Medications  Medication Sig Dispense Refill  . aspirin EC 81 MG EC tablet Take 1 tablet (81 mg total) by mouth daily. 30 tablet 0  . atorvastatin (LIPITOR) 80 MG tablet Take 1 tablet (80 mg total) by mouth daily at 6 PM. 30 tablet 0  . metoprolol tartrate (LOPRESSOR) 25 MG tablet Take 1 tablet (25 mg total) by mouth 2 (two) times daily. 60 tablet 0  . nicotine (NICODERM CQ - DOSED IN MG/24 HOURS) 21 mg/24hr patch Place 1 patch (21 mg total) onto the skin daily. 14 patch 0  . nicotine (NICODERM CQ) 14 mg/24hr patch Place 1 patch (14 mg total) onto the skin daily. Start when the 21 mg patches  are finished 14 patch 0  . nicotine (NICODERM CQ) 7 mg/24hr patch Place 1 patch (7 mg total) onto the skin daily. Start after 14 mg patches are finished. 14 patch 0  . nitroGLYCERIN (NITROSTAT) 0.4 MG SL tablet Place 1 tablet (0.4 mg total) under the tongue every 5 (five) minutes as needed for chest pain. 30 tablet 0  . ticagrelor (BRILINTA) 90 MG TABS tablet Take 1 tablet (90 mg total) by mouth 2 (two) times daily. 60 tablet 0   No current facility-administered medications for this visit.     Allergies:   Penicillins    Social History:  The patient  reports that she has been smoking cigarettes. She has been smoking about 0.50 packs per day. She has never used smokeless tobacco. She reports current alcohol use. She reports that she does not use drugs.   Family History:  The patient's family history includes Healthy in her sister; Kidney disease in her mother; Pancreatic cancer in her father.    ROS:  Please see the history of present illness.   Otherwise, review of systems are positive for none.   All other systems are reviewed and negative.    PHYSICAL EXAM: VS:  BP 102/66   Pulse (!) 56   Ht 5\' 5"  (1.651 m)   Wt 263 lb 12.8 oz (119.7  kg)   SpO2 98%   BMI 43.90 kg/m  , BMI Body mass index is 43.9 kg/m. Affect appropriate Healthy:  appears stated age HEENT: normal Neck supple with no adenopathy JVP normal no bruits no thyromegaly Lungs clear with no wheezing and good diaphragmatic motion Heart:  S1/S2 no murmur, no rub, gallop or click PMI normal Abdomen: benighn, BS positve, no tenderness, no AAA no bruit.  No HSM or HJR Distal pulses intact with no bruits No edema Neuro non-focal Skin warm and dry No muscular weakness    EKG:  12/25/17 SR inferior lateral T wave changes    Recent Labs: 12/23/2017: ALT 21; Magnesium 2.0; TSH 2.776 12/25/2017: BUN 10; Creatinine, Ser 0.80; Hemoglobin 13.8; Platelets 244; Potassium 4.1; Sodium 141    Lipid Panel    Component  Value Date/Time   CHOL 208 (H) 12/24/2017 0325   TRIG 124 12/24/2017 0325   HDL 50 12/24/2017 0325   CHOLHDL 4.2 12/24/2017 0325   VLDL 25 12/24/2017 0325   LDLCALC 133 (H) 12/24/2017 0325      Wt Readings from Last 3 Encounters:  01/08/18 263 lb 12.8 oz (119.7 kg)  12/25/17 258 lb 9.6 oz (117.3 kg)      Other studies Reviewed: Additional studies/ records that were reviewed today include: Notes from hospital ECG, labs and cath films 12/25/17.    ASSESSMENT AND PLAN:  1.  CAD:  Post DES to mid circumflex for single vessel disease 12/25/17 continue DAT for a year Has SL nitro 2. HLD:  Continue statin labs with primary  3. DM:  Discussed low carb diet.  Target hemoglobin A1c is 6.5 or less.  Continue current medications. 4. Smoking discussed cessation and link to recurrent CAD CXR in hospital 12/25/17 NAD   Current medicines are reviewed at length with the patient today.  The patient does not have concerns regarding medicines.  The following changes have been made:  no change  Labs/ tests ordered today include: None   Orders Placed This Encounter  Procedures  . AMB referral to cardiac rehabilitation     Disposition:   FU with cardiology in 6 months      Signed, Charlton HawsPeter Judd Mccubbin, MD  01/08/2018 3:35 PM    [facility] [address], [place], [place]  [address] Phone: [phone]; Fax: [phone]

## 2018-01-08 ENCOUNTER — Ambulatory Visit (INDEPENDENT_AMBULATORY_CARE_PROVIDER_SITE_OTHER): Payer: 59 | Admitting: Cardiovascular Disease

## 2018-01-08 ENCOUNTER — Encounter: Payer: Self-pay | Admitting: Cardiovascular Disease

## 2018-01-08 VITALS — BP 102/66 | HR 56 | Ht 65.0 in | Wt 263.8 lb

## 2018-01-08 DIAGNOSIS — E785 Hyperlipidemia, unspecified: Secondary | ICD-10-CM | POA: Diagnosis not present

## 2018-01-08 DIAGNOSIS — I2510 Atherosclerotic heart disease of native coronary artery without angina pectoris: Secondary | ICD-10-CM | POA: Diagnosis not present

## 2018-01-08 NOTE — Patient Instructions (Addendum)
Medication Instructions:   If you need a refill on your cardiac medications before your next appointment, please call your pharmacy.   Lab work:  If you have labs (blood work) drawn today and your tests are completely normal, you will receive your results only by: Marland Kitchen MyChart Message (if you have MyChart) OR . A paper copy in the mail If you have any lab test that is abnormal or we need to change your treatment, we will call you to review the results.  Testing/Procedures: None ordered today.  Follow-Up: At [facility], you and your health needs are our priority.  As part of our continuing mission to provide you with exceptional heart care, we have created designated Provider Care Teams.  These Care Teams include your primary Cardiologist (physician) and Advanced Practice Providers (APPs -  Physician Assistants and Nurse Practitioners) who all work together to provide you with the care you need, when you need it. You will need a follow up appointment in 12 months.  Please call our office 2 months in advance to schedule this appointment.  You may see Dr. Eden Emms or one of the following Advanced Practice Providers on your designated Care Team:   Norma Fredrickson, NP Nada Boozer, NP . Georgie Chard, NP   You have been referred to [facility] they will call you with an appointment.

## 2018-01-14 DIAGNOSIS — I2510 Atherosclerotic heart disease of native coronary artery without angina pectoris: Secondary | ICD-10-CM | POA: Insufficient documentation

## 2018-01-14 DIAGNOSIS — I2583 Coronary atherosclerosis due to lipid rich plaque: Secondary | ICD-10-CM | POA: Insufficient documentation

## 2018-01-15 ENCOUNTER — Telehealth: Payer: Self-pay | Admitting: Cardiovascular Disease

## 2018-01-15 NOTE — Telephone Encounter (Signed)
New Message   Pt c/o medication issue:  1. Name of Medication: Brilinta   2. How are you currently taking this medication (dosage and times per day)? 90mg  twice a day  3. Are you having a reaction (difficulty breathing--STAT)? No   4. What is your medication issue? Patient wants to know if it's ok for her to take the generic brand instead of the name brand for Brilinta.

## 2018-01-15 NOTE — Telephone Encounter (Signed)
Left message for patient to call back  

## 2018-01-22 NOTE — Telephone Encounter (Signed)
Called and spoke with pt in regards to CR, pt stated she was going into a meeting and will give [place] a call back.  If pt does not call back will follow up with pt in a week.

## 2018-01-31 NOTE — Telephone Encounter (Signed)
Waiting for patient to return call.

## 2018-02-04 ENCOUNTER — Telehealth (HOSPITAL_COMMUNITY): Payer: Self-pay

## 2018-02-04 NOTE — Telephone Encounter (Signed)
Pt called and stated she would like to have more time to think about the program.  Will follow up with her in a week.

## 2018-02-08 NOTE — Telephone Encounter (Signed)
Called and spoke with pt in regards to CR, pt stated she is not able to join at this time due to the cost of the program. I did offer pt our CR maintenance program.  Closed referral

## 2018-02-11 ENCOUNTER — Telehealth: Payer: Self-pay | Admitting: Cardiovascular Disease

## 2018-02-11 NOTE — Telephone Encounter (Signed)
Patient went to see her PCP and advise her not to stop her brilinta and ASA. Informed patient that her PCP is correct and not to stop her Brilinta or ASA, since she had stent with heart cath on 12/2017. Encouraged patient to call our office is her symptoms get worse or do not resolve. Patient verbalized understanding.

## 2018-02-11 NOTE — Telephone Encounter (Signed)
New Message   Patient states she hit her hand and now it's swollen and she has bruising around her knuckles.  Patient believes her blood thinners could be affecting it wants to speak to nurse.

## 2018-11-01 ENCOUNTER — Other Ambulatory Visit: Payer: Self-pay

## 2018-11-01 DIAGNOSIS — Z20822 Contact with and (suspected) exposure to covid-19: Secondary | ICD-10-CM

## 2018-11-02 LAB — NOVEL CORONAVIRUS, NAA: SARS-CoV-2, NAA: NOT DETECTED

## 2018-12-09 ENCOUNTER — Other Ambulatory Visit: Payer: Self-pay | Admitting: Cardiovascular Disease

## 2018-12-09 MED ORDER — TICAGRELOR 90 MG PO TABS: 90.0000 mg | ORAL_TABLET | Freq: Two times a day (BID) | ORAL | 0 refills | Status: DC

## 2018-12-09 MED ORDER — METOPROLOL TARTRATE 25 MG PO TABS: 25.0000 mg | ORAL_TABLET | Freq: Two times a day (BID) | ORAL | 0 refills | Status: DC

## 2018-12-09 MED ORDER — ATORVASTATIN CALCIUM 80 MG PO TABS: 80.0000 mg | ORAL_TABLET | Freq: Every day | ORAL | 0 refills | Status: DC

## 2018-12-09 NOTE — Telephone Encounter (Signed)
Pt's medications were sent to pt's pharmacy as requested. Confirmation received.  

## 2019-01-31 NOTE — Progress Notes (Signed)
Cardiology Office Note   Date:  02/06/2019   ID:  Gina Morris, DOB 19-Sep-1955, MRN [id number]  PCP:  Raymond Gurney, MD  Cardiologist:   Jenkins Rouge, MD   No chief complaint on file.     History of Present Illness: Gina Morris is a 64 y.o. female who presents for f/u CAD.  Obese smoker with NSTEMI 12/25/17 Troponin peak 2.02. Cath with single vessel disease DES to mid AV circumflex. EF normal  Significant GERD, smoking and HLD on statin CXR 12/23/17 NAD  No angina compliant with meds Ok to stop DAT at this time  She lives alone with her Orpha Bur Sedentary weight is up No angina Dyspnea likely from weight gain Requesting CXR Quit smoking after MI     Past Medical History:  Diagnosis Date  . Baker's cyst of knee, left   . Coronary artery disease   . Morbid obesity ([place])   . Tobacco abuse     Past Surgical History:  Procedure Laterality Date  . CORONARY STENT INTERVENTION N/A 12/24/2017   Procedure: CORONARY STENT INTERVENTION;  Surgeon: Lorretta Harp, MD;  Location: [place] [facility];  Service: Cardiovascular;  Laterality: N/A;  . CORONARY STENT PLACEMENT  12/24/2017  . LEFT HEART CATH AND CORONARY ANGIOGRAPHY N/A 12/24/2017   Procedure: LEFT HEART CATH AND CORONARY ANGIOGRAPHY;  Surgeon: Lorretta Harp, MD;  Location: [place] [facility];  Service: Cardiovascular;  Laterality: N/A;  . TONSILLECTOMY       Current Outpatient Medications  Medication Sig Dispense Refill  . aspirin EC 81 MG EC tablet Take 1 tablet (81 mg total) by mouth daily. 30 tablet 0  . atorvastatin (LIPITOR) 80 MG tablet Take 1 tablet (80 mg total) by mouth daily at 6 PM. Please keep upcoming appt in February with Dr. Johnsie Cancel before anymore refills. Thank you 90 tablet 0  . metoprolol tartrate (LOPRESSOR) 25 MG tablet Take 1 tablet (25 mg total) by mouth 2 (two) times daily. Please keep appt in February with Dr. Johnsie Cancel before anymore refills. Thank you 180 tablet 0  .  nitroGLYCERIN (NITROSTAT) 0.4 MG SL tablet Place 1 tablet (0.4 mg total) under the tongue every 5 (five) minutes as needed for chest pain. 30 tablet 0  . ticagrelor (BRILINTA) 90 MG TABS tablet Take 1 tablet (90 mg total) by mouth 2 (two) times daily. Please keep appt in February with Dr. Johnsie Cancel before anymore refills. Thank you 180 tablet 0   No current facility-administered medications for this visit.    Allergies:   Penicillins    Social History:  The patient  reports that she has been smoking cigarettes. She has been smoking about 0.50 packs per day. She has never used smokeless tobacco. She reports current alcohol use. She reports that she does not use drugs.   Family History:  The patient's family history includes Healthy in her sister; Kidney disease in her mother; Pancreatic cancer in her father.    ROS:  Please see the history of present illness.   Otherwise, review of systems are positive for none.   All other systems are reviewed and negative.    PHYSICAL EXAM: VS:  BP 122/86   Pulse 61   Ht 5\' 5"  (1.651 m)   Wt 277 lb (125.6 kg)   SpO2 98%   BMI 46.10 kg/m  , BMI Body mass index is 46.1 kg/m. Affect appropriate Healthy:  appears stated age 64: normal Neck supple with no adenopathy  JVP normal no bruits no thyromegaly Lungs clear with no wheezing and good diaphragmatic motion Heart:  S1/S2 no murmur, no rub, gallop or click PMI normal Abdomen: benighn, BS positve, no tenderness, no AAA no bruit.  No HSM or HJR Distal pulses intact with no bruits No edema Neuro non-focal Skin warm and dry No muscular weakness    EKG:  12/25/17 SR inferior lateral T wave changes    Recent Labs: No results found for requested labs within last 8760 hours.    Lipid Panel    Component Value Date/Time   CHOL 208 (H) 12/24/2017 0325   TRIG 124 12/24/2017 0325   HDL 50 12/24/2017 0325   CHOLHDL 4.2 12/24/2017 0325   VLDL 25 12/24/2017 0325   LDLCALC 133 (H) 12/24/2017  0325      Wt Readings from Last 3 Encounters:  02/06/19 277 lb (125.6 kg)  01/08/18 263 lb 12.8 oz (119.7 kg)  12/25/17 258 lb 9.6 oz (117.3 kg)      Other studies Reviewed: Additional studies/ records that were reviewed today include: Notes from hospital ECG, labs and cath films 12/25/17.    ASSESSMENT AND PLAN:  1. CAD:  Post DES to mid circumflex for single vessel disease 12/25/17 ok to Rx with just ASA now  Has SL nitro 2. HLD:  Continue statin labs with primary  3. DM:  Discussed low carb diet.  Target hemoglobin A1c is 6.5 or less.  Continue current medications. 4. Smoking quit after MI f/u CXR ordered    Current medicines are reviewed at length with the patient today.  The patient does not have concerns regarding medicines.  The following changes have been made:  no change  Labs/ tests ordered today include: None   Orders Placed This Encounter  Procedures  . DG Chest 2 View  . Lipid panel  . Hepatic function panel  . EKG 12-Lead     Disposition:   FU with cardiology in a year     Signed, Charlton Haws, MD  02/06/2019 9:53 AM    [facility] [address] [place], [place], [place]  [address] Phone: [phone]; Fax: [phone]

## 2019-02-06 ENCOUNTER — Other Ambulatory Visit: Payer: Self-pay

## 2019-02-06 ENCOUNTER — Ambulatory Visit
Admission: RE | Admit: 2019-02-06 | Discharge: 2019-02-06 | Disposition: A | Discharge status: Home / Self Care | Payer: 59 | Source: Ambulatory Visit | Attending: Cardiovascular Disease | Admitting: Cardiovascular Disease

## 2019-02-06 ENCOUNTER — Encounter: Payer: Self-pay | Admitting: Cardiovascular Disease

## 2019-02-06 ENCOUNTER — Ambulatory Visit (INDEPENDENT_AMBULATORY_CARE_PROVIDER_SITE_OTHER): Payer: 59 | Admitting: Cardiovascular Disease

## 2019-02-06 VITALS — BP 122/86 | HR 61 | Ht 65.0 in | Wt 277.0 lb

## 2019-02-06 DIAGNOSIS — R0600 Dyspnea, unspecified: Secondary | ICD-10-CM

## 2019-02-06 DIAGNOSIS — I214 Non-ST elevation (NSTEMI) myocardial infarction: Secondary | ICD-10-CM | POA: Diagnosis not present

## 2019-02-06 DIAGNOSIS — E785 Hyperlipidemia, unspecified: Secondary | ICD-10-CM

## 2019-02-06 LAB — LIPID PANEL
Chol/HDL Ratio: 2.4 ratio (ref 0.0–4.4)
Cholesterol, Total: 130 mg/dL (ref 100–199)
HDL: 54 mg/dL (ref >39)
LDL Chol Calc (NIH): 58 mg/dL (ref 0–99)
Triglycerides: 94 mg/dL (ref 0–149)
VLDL Cholesterol Cal: 18 mg/dL (ref 5–40)

## 2019-02-06 LAB — HEPATIC FUNCTION PANEL
ALT: 21 IU/L (ref 0–32)
AST: 14 IU/L (ref 0–40)
Albumin: 4.1 g/dL (ref 3.8–4.8)
Alkaline Phosphatase: 111 IU/L (ref 39–117)
Bilirubin Total: 0.5 mg/dL (ref 0.0–1.2)
Bilirubin, Direct: 0.17 mg/dL (ref 0.00–0.40)
Total Protein: 6.1 g/dL (ref 6.0–8.5)

## 2019-02-06 NOTE — Patient Instructions (Addendum)
Your physician recommends that you continue on your current medications as directed. Please refer to the Current Medication list given to you today. Your physician recommends that you return for lab work in: FASTING LIPID AND LIVER  A chest x-ray takes a picture of the organs and structures inside the chest, including the heart, lungs, and blood vessels. This test can show several things, including, whether the heart is enlarges; whether fluid is building up in the lungs; and whether pacemaker / defibrillator leads are still in place.   Your physician wants you to follow-up in: YEAR WITH DR Haywood Filler will receive a reminder letter in the mail two months in advance. If you don't receive a letter, please call our office to schedule the follow-up appointment.

## 2019-02-07 ENCOUNTER — Other Ambulatory Visit: Payer: Self-pay

## 2019-02-07 DIAGNOSIS — E785 Hyperlipidemia, unspecified: Secondary | ICD-10-CM

## 2019-02-07 NOTE — Progress Notes (Signed)
Per Dr. Eden Emms, cholesterol is at goal and liver function test was normal. Needs lab work repeated in 6 months. Sent message to patient through mychart to call office to make appointment for lab.

## 2019-06-09 MED ORDER — ATORVASTATIN CALCIUM 80 MG PO TABS: 80.0000 mg | ORAL_TABLET | Freq: Every day | ORAL | 3 refills | Status: DC

## 2019-06-09 MED ORDER — METOPROLOL TARTRATE 25 MG PO TABS: 25.0000 mg | ORAL_TABLET | Freq: Two times a day (BID) | ORAL | 3 refills | Status: DC

## 2019-06-09 MED ORDER — METOPROLOL TARTRATE 25 MG PO TABS: 25.0000 mg | ORAL_TABLET | Freq: Two times a day (BID) | ORAL | 0 refills | Status: DC

## 2019-06-09 MED ORDER — ATORVASTATIN CALCIUM 80 MG PO TABS: 80.0000 mg | ORAL_TABLET | Freq: Every day | ORAL | 0 refills | Status: DC

## 2019-07-17 ENCOUNTER — Encounter: Payer: Self-pay | Admitting: Nurse Practitioner

## 2019-07-17 ENCOUNTER — Ambulatory Visit (INDEPENDENT_AMBULATORY_CARE_PROVIDER_SITE_OTHER): Payer: 59 | Admitting: Nurse Practitioner

## 2019-07-17 ENCOUNTER — Other Ambulatory Visit (INDEPENDENT_AMBULATORY_CARE_PROVIDER_SITE_OTHER): Payer: 59

## 2019-07-17 VITALS — BP 130/72 | HR 61 | Ht 65.0 in | Wt 278.0 lb

## 2019-07-17 DIAGNOSIS — R1012 Left upper quadrant pain: Secondary | ICD-10-CM | POA: Diagnosis not present

## 2019-07-17 DIAGNOSIS — K219 Gastro-esophageal reflux disease without esophagitis: Secondary | ICD-10-CM

## 2019-07-17 DIAGNOSIS — R748 Abnormal levels of other serum enzymes: Secondary | ICD-10-CM

## 2019-07-17 LAB — HEPATIC FUNCTION PANEL
ALT: 18 U/L (ref 0–35)
AST: 13 U/L (ref 0–37)
Albumin: 4.3 g/dL (ref 3.5–5.2)
Alkaline Phosphatase: 95 U/L (ref 39–117)
Bilirubin, Direct: 0.1 mg/dL (ref 0.0–0.3)
Total Bilirubin: 0.7 mg/dL (ref 0.2–1.2)
Total Protein: 6.9 g/dL (ref 6.0–8.3)

## 2019-07-17 LAB — LIPASE: Lipase: 24 U/L (ref 11.0–59.0)

## 2019-07-17 MED ORDER — OMEPRAZOLE 20 MG PO CPDR: 20.0000 mg | DELAYED_RELEASE_CAPSULE | Freq: Every day | ORAL | 1 refills | Status: DC

## 2019-07-17 NOTE — Progress Notes (Signed)
Agree with assessment and plan as outlined.  

## 2019-07-17 NOTE — Patient Instructions (Signed)
If you are age 64 or older, your body mass index should be between 23-30. Your Body mass index is 46.26 kg/m. If this is out of the aforementioned range listed, please consider follow up with your Primary Care Provider.  If you are age 31 or younger, your body mass index should be between 19-25. Your Body mass index is 46.26 kg/m. If this is out of the aformentioned range listed, please consider follow up with your Primary Care Provider.   Your provider has requested that you go to the basement level for lab work before leaving today. Press "B" on the elevator. The lab is located at the first door on the left as you exit the elevator.  We have sent the following medications to your pharmacy for you to pick up at your convenience: Omeprazole 20mg   Gastroesophageal Reflux Disease, Adult Gastroesophageal reflux (GER) happens when acid from the stomach flows up into the tube that connects the mouth and the stomach (esophagus). Normally, food travels down the esophagus and stays in the stomach to be digested. With GER, food and stomach acid sometimes move back up into the esophagus. You may have a disease called gastroesophageal reflux disease (GERD) if the reflux:  Happens often.  Causes frequent or very bad symptoms.  Causes problems such as damage to the esophagus. When this happens, the esophagus becomes sore and swollen (inflamed). Over time, GERD can make small holes (ulcers) in the lining of the esophagus. What are the causes? This condition is caused by a problem with the muscle between the esophagus and the stomach. When this muscle is weak or not normal, it does not close properly to keep food and acid from coming back up from the stomach. The muscle can be weak because of:  Tobacco use.  Pregnancy.  Having a certain type of hernia (hiatal hernia).  Alcohol use.  Certain foods and drinks, such as coffee, chocolate, onions, and peppermint. What increases the risk? You are more  likely to develop this condition if you:  Are overweight.  Have a disease that affects your connective tissue.  Use NSAID medicines. What are the signs or symptoms? Symptoms of this condition include:  Heartburn.  Difficult or painful swallowing.  The feeling of having a lump in the throat.  A bitter taste in the mouth.  Bad breath.  Having a lot of saliva.  Having an upset or bloated stomach.  Belching.  Chest pain. Different conditions can cause chest pain. Make sure you see your doctor if you have chest pain.  Shortness of breath or noisy breathing (wheezing).  Ongoing (chronic) cough or a cough at night.  Wearing away of the surface of teeth (tooth enamel).  Weight loss. How is this treated? Treatment will depend on how bad your symptoms are. Your doctor may suggest:  Changes to your diet.  Medicine.  Surgery. Follow these instructions at home: Eating and drinking   Follow a diet as told by your doctor. You may need to avoid foods and drinks such as: ? Coffee and tea (with or without caffeine). ? Drinks that contain alcohol. ? Energy drinks and sports drinks. ? Bubbly (carbonated) drinks or sodas. ? Chocolate and cocoa. ? Peppermint and mint flavorings. ? Garlic and onions. ? Horseradish. ? Spicy and acidic foods. These include peppers, chili powder, curry powder, vinegar, hot sauces, and BBQ sauce. ? Citrus fruit juices and citrus fruits, such as oranges, lemons, and limes. ? Tomato-based foods. These include red sauce, chili, salsa,  and pizza with red sauce. ? Fried and fatty foods. These include donuts, french fries, potato chips, and high-fat dressings. ? High-fat meats. These include hot dogs, rib eye steak, sausage, ham, and bacon. ? High-fat dairy items, such as whole milk, butter, and cream cheese.  Eat small meals often. Avoid eating large meals.  Avoid drinking large amounts of liquid with your meals.  Avoid eating meals during the 2-3  hours before bedtime.  Avoid lying down right after you eat.  Do not exercise right after you eat. Lifestyle   Do not use any products that contain nicotine or tobacco. These include cigarettes, e-cigarettes, and chewing tobacco. If you need help quitting, ask your doctor.  Try to lower your stress. If you need help doing this, ask your doctor.  If you are overweight, lose an amount of weight that is healthy for you. Ask your doctor about a safe weight loss goal. General instructions  Pay attention to any changes in your symptoms.  Take over-the-counter and prescription medicines only as told by your doctor. Do not take aspirin, ibuprofen, or other NSAIDs unless your doctor says it is okay.  Wear loose clothes. Do not wear anything tight around your waist.  Raise (elevate) the head of your bed about 6 inches (15 cm).  Avoid bending over if this makes your symptoms worse.  Keep all follow-up visits as told by your doctor. This is important. Contact a doctor if:  You have new symptoms.  You lose weight and you do not know why.  You have trouble swallowing or it hurts to swallow.  You have wheezing or a cough that keeps happening.  Your symptoms do not get better with treatment.  You have a hoarse voice. Get help right away if:  You have pain in your arms, neck, jaw, teeth, or back.  You feel sweaty, dizzy, or light-headed.  You have chest pain or shortness of breath.  You throw up (vomit) and your throw-up looks like blood or coffee grounds.  You pass out (faint).  Your poop (stool) is bloody or black.  You cannot swallow, drink, or eat. Summary  If a person has gastroesophageal reflux disease (GERD), food and stomach acid move back up into the esophagus and cause symptoms or problems such as damage to the esophagus.  Treatment will depend on how bad your symptoms are.  Follow a diet as told by your doctor.  Take all medicines only as told by your  doctor. This information is not intended to replace advice given to you by your health care provider. Make sure you discuss any questions you have with your health care provider. Document Revised: 06/27/2017 Document Reviewed: 06/27/2017 Elsevier Patient Education  2020 [employer].  Food Choices for Gastroesophageal Reflux Disease, Adult When you have gastroesophageal reflux disease (GERD), the foods you eat and your eating habits are very important. Choosing the right foods can help ease your discomfort. Think about working with a nutrition specialist (dietitian) to help you make good choices. What are tips for following this plan?  Meals  Choose healthy foods that are low in fat, such as fruits, vegetables, whole grains, low-fat dairy products, and lean meat, fish, and poultry.  Eat small meals often instead of 3 large meals a day. Eat your meals slowly, and in a place where you are relaxed. Avoid bending over or lying down until 2-3 hours after eating.  Avoid eating meals 2-3 hours before bed.  Avoid drinking a lot of  liquid with meals.  Cook foods using methods other than frying. Bake, grill, or broil food instead.  Avoid or limit: ? Chocolate. ? Peppermint or spearmint. ? Alcohol. ? Pepper. ? Black and decaffeinated coffee. ? Black and decaffeinated tea. ? Bubbly (carbonated) soft drinks. ? Caffeinated energy drinks and soft drinks.  Limit high-fat foods such as: ? Fatty meat or fried foods. ? Whole milk, cream, butter, or ice cream. ? Nuts and nut butters. ? Pastries, donuts, and sweets made with butter or shortening.  Avoid foods that cause symptoms. These foods may be different for everyone. Common foods that cause symptoms include: ? Tomatoes. ? Oranges, lemons, and limes. ? Peppers. ? Spicy food. ? Onions and garlic. ? Vinegar. Lifestyle  Maintain a healthy weight. Ask your doctor what weight is healthy for you. If you need to lose weight, work with your  doctor to do so safely.  Exercise for at least 30 minutes for 5 or more days each week, or as told by your doctor.  Wear loose-fitting clothes.  Do not smoke. If you need help quitting, ask your doctor.  Sleep with the head of your bed higher than your feet. Use a wedge under the mattress or blocks under the bed frame to raise the head of the bed. Summary  When you have gastroesophageal reflux disease (GERD), food and lifestyle choices are very important in easing your symptoms.  Eat small meals often instead of 3 large meals a day. Eat your meals slowly, and in a place where you are relaxed.  Limit high-fat foods such as fatty meat or fried foods.  Avoid bending over or lying down until 2-3 hours after eating.  Avoid peppermint and spearmint, caffeine, alcohol, and chocolate. This information is not intended to replace advice given to you by your health care provider. Make sure you discuss any questions you have with your health care provider. Document Revised: 04/11/2018 Document Reviewed: 01/25/2016 Elsevier Patient Education  [employer].  Due to recent changes in healthcare laws, you may see the results of your imaging and laboratory studies on MyChart before your provider has had a chance to review them.  We understand that in some cases there may be results that are confusing or concerning to you. Not all laboratory results come back in the same time frame and the provider may be waiting for multiple results in order to interpret others.  Please give [place] 48 hours in order for your provider to thoroughly review all the results before contacting the office for clarification of your results.   Thank you for choosing [facility] Arnaldo Natal, CRNP

## 2019-07-17 NOTE — Progress Notes (Addendum)
07/17/2019 Gina Morris [id number] 1955/01/24   CHIEF COMPLAINT: LUQ pain   HISTORY OF PRESENT ILLNESS:  Gina Morris is a 64 year old female with a past medical history of obesity, acid reflux and coronary artery disease s/p NSTEMI s/p DES 12/25/17 on ASA, no longer on Brilinta. She presents to our office today due to having LUQ pain which started 2 months ago. She describes her LUQ discomfort as a burning or tearing sensation that moves downward and lasts for less than 5 seconds.  Eating larger meals or spicy foods trigger this discomfort.  She has an occasional twinge of left upper quadrant abdominal pain that occurs randomly.  No dysphagia.  Infrequent nausea.  No vomiting.  She is passing a normal brown bowel movement daily.  No rectal bleeding or black stools.  She takes Advil 2 tabs daily as needed for headaches, approximately once monthly.  She drinks 4 cups of coffee daily.  She took Omeprazole 20 mg once daily for approximately 8 weeks and stopped taking it 2 weeks ago because her left upper quadrant pain improved.  She is taking a probiotic daily for the past 2 weeks as well.  She underwent an EGD in [place] proximately 20 years ago which identified acid reflux.  No ulcers or H. pylori.  She underwent a screening colonoscopy at the age of 86 when living in [place] which she reported was normal.  Her most recent colonoscopy was completed by [facility] 3 years ago which she reported was normal.  I will request a copy of her [facility] colonoscopy for further review.  No family history of colorectal cancer.  No chest pain, palpitations or shortness of breath.  She last saw her cardiologist, Dr. Johnsie Cancel, 02/06/2019, her cardiac exam was stable and she was advised to follow up in office in 1 year. Father died from pancreatic cancer in his 75s.  No other complaints today.  Labs 05/23/2019: Glucose 106.  BUN 16.  Creatinine 0.89.  Alk phos 127.  Total bili 0.4.  Albumin 4.4.  AST 14.  ALT 21.  TSH  1.58.  WBC 7.7.  Hemoglobin 14.1.  Hematocrit 42.3.  Platelet 276.  Past Medical History:  Diagnosis Date  . Baker's cyst of knee, left   . Coronary artery disease   . Morbid obesity ([place])   . Tobacco abuse    Past Surgical History:  Procedure Laterality Date  . CORONARY STENT INTERVENTION N/A 12/24/2017   Procedure: CORONARY STENT INTERVENTION;  Surgeon: Lorretta Harp, MD;  Location: [place] [facility];  Service: Cardiovascular;  Laterality: N/A;  . CORONARY STENT PLACEMENT  12/24/2017  . LEFT HEART CATH AND CORONARY ANGIOGRAPHY N/A 12/24/2017   Procedure: LEFT HEART CATH AND CORONARY ANGIOGRAPHY;  Surgeon: Lorretta Harp, MD;  Location: [place] [facility];  Service: Cardiovascular;  Laterality: N/A;  . TONSILLECTOMY      Social History: Single. Daughter age 40.  Smoked cigarettes 1/2 ppd age 81 stopped smoking 12/2017. One glass of wine every evening. No drug use.   Family History:  Father died in 81's from pancreatic cancer. Mother age 80 history of arthritis, DVT (blood clotting disorder) and diverticulitis. MGM had uterine cancer.    Allergies  Allergen Reactions  . Penicillins Other (See Comments)    Unknown reaction  DID THE REACTION INVOLVE: Swelling of the face/tongue/throat, SOB, or low BP? Unknown Sudden or severe rash/hives, skin peeling, or the inside of the mouth or nose? Unknown  Did it require medical treatment? Unknown When did it last happen? If all above answers are "NO", may proceed with cephalosporin use.       Outpatient Encounter Medications as of 07/17/2019  Medication Sig  . aspirin EC 81 MG EC tablet Take 1 tablet (81 mg total) by mouth daily.  Marland Kitchen atorvastatin (LIPITOR) 80 MG tablet Take 1 tablet (80 mg total) by mouth daily at 6 PM.  . metoprolol tartrate (LOPRESSOR) 25 MG tablet Take 1 tablet (25 mg total) by mouth 2 (two) times daily.  . nitroGLYCERIN (NITROSTAT) 0.4 MG SL tablet Place 1 tablet (0.4 mg total) under the tongue every 5  (five) minutes as needed for chest pain.  . ticagrelor (BRILINTA) 90 MG TABS tablet Take 1 tablet (90 mg total) by mouth 2 (two) times daily. Please keep appt in February with Dr. Johnsie Cancel before anymore refills. Thank you   No facility-administered encounter medications on file as of 07/17/2019.     REVIEW OF SYSTEMS: Gen: Denies fever, sweats or chills. No weight loss.  CV: Denies chest pain, palpitations or edema. Resp: Denies cough, shortness of breath of hemoptysis.  GI: See HPI. GU : Denies urinary burning, blood in urine, increased urinary frequency or incontinence. MS: Denies joint pain, muscles aches or weakness. Derm: Denies rash, itchiness, skin lesions or unhealing ulcers. Psych: Denies depression, anxiety, memory loss or confusion. Heme: Denies bruising, bleeding. Neuro:  Denies headaches, dizziness or paresthesias. Endo:  Denies any problems with DM, thyroid or adrenal function.    PHYSICAL EXAM: BP 130/72   Pulse 61   Ht _0  (1.651 m)   Wt 278 lb (126.1 kg)   BMI 46.26 kg/m   General: Well 64 year old female in no acute distress. Head: Normocephalic and atraumatic. Eyes:  Sclerae non-icteric, conjunctive pink. Ears: Normal auditory acuity. Mouth: Dentition intact. No ulcers or lesions.  Neck: Supple, no lymphadenopathy or thyromegaly.  Lungs: Clear bilaterally to auscultation without wheezes, crackles or rhonchi. Heart: Regular rate and rhythm. No murmur, rub or gallop appreciated.  Abdomen: Obese abdomen. Soft, nontender, non distended. No masses. No hepatosplenomegaly. Normoactive bowel sounds x 4 quadrants.  Rectal: Deferred.  Musculoskeletal: Symmetrical with no gross deformities. Skin: Warm and dry. No rash or lesions on visible extremities. Extremities: No edema. Neurological: Alert oriented x 4, no focal deficits.  Psychological:  Alert and cooperative. Normal mood and affect.  ASSESSMENT AND PLAN:  6. 64 year old female with a remote history of  GERD presents with LUQ pain. She wishes to pursue endoscopic evaluation.  -EGD benefits and risks discussed including risk with sedation, risk of bleeding, perforation and infection  -discussed scheduling CTAP if EGD negative.  -Omeprazole 68m QD -GERD diet handout   2. Colon cancer screening -Request copy of colonoscopy done by [facility] 2 or 3 years ago  3. Elevated alk phos level -Hepatic panel, alk phos isoenzymes  4. Family history of pancreatic cancer (father)  540 History of CAD  s/p NSTEMI s/p DES 12/25/17 on ASA   ADDENDUM: Colonoscopy 03/30/2016 records from [facility] received.  Colonoscopy 03/30/2016 by Dr. EOletta Lamas A few medium mouthed diverticula in the sigmoid colon. Non-bleeding internal hemorrhoids were found during retroflexion.  The exam was otherwise without abnormality.     CC:  ZRaymond Gurney MD

## 2019-07-21 LAB — ALKALINE PHOSPHATASE, ISOENZYMES
Alkaline Phosphatase: 112 IU/L (ref 48–121)
BONE FRACTION: 30 % (ref 14–68)
INTESTINAL FRAC.: 0 % (ref 0–18)
LIVER FRACTION: 70 % (ref 18–85)

## 2019-07-29 ENCOUNTER — Encounter: Payer: Self-pay | Admitting: Gastroenterology

## 2019-08-01 ENCOUNTER — Telehealth: Payer: Self-pay | Admitting: Nurse Practitioner

## 2019-08-01 NOTE — Telephone Encounter (Signed)
[facility] Dept received 16 pages of medical records from [facility]. Sending via interoffice mail to Dr. Riley Kill 08/01/19  Gasper Lloyd

## 2019-08-04 ENCOUNTER — Other Ambulatory Visit: Payer: Self-pay | Admitting: Gastroenterology

## 2019-08-04 ENCOUNTER — Encounter: Payer: Self-pay | Admitting: Gastroenterology

## 2019-08-04 ENCOUNTER — Ambulatory Visit (AMBULATORY_SURGERY_CENTER): Payer: 59 | Admitting: Gastroenterology

## 2019-08-04 ENCOUNTER — Other Ambulatory Visit: Payer: Self-pay

## 2019-08-04 VITALS — BP 105/66 | HR 60 | Temp 97.5°F | Resp 18 | Ht 65.0 in | Wt 278.0 lb

## 2019-08-04 DIAGNOSIS — R1012 Left upper quadrant pain: Secondary | ICD-10-CM

## 2019-08-04 DIAGNOSIS — K219 Gastro-esophageal reflux disease without esophagitis: Secondary | ICD-10-CM | POA: Diagnosis not present

## 2019-08-04 MED ORDER — SODIUM CHLORIDE 0.9 % IV SOLN: 500.0000 mL | INTRAVENOUS | Status: DC

## 2019-08-04 NOTE — Op Note (Signed)
[facility] Patient Name: Gina Morris Procedure Date: 08/04/2019 8:24 AM MRN: [id number] Endoscopist: Viviann Spare P. Adela Lank , MD Age: 64 Referring MD:  Date of Birth: Sep 20, 1955 Gender: Female Account #: [id number] Procedure:                Upper GI endoscopy Indications:              Abdominal pain in the left upper quadrant - can be                            precipitated by eating, history of                            gastro-esophageal reflux disease, on omeprazole20mg                             previously with improvement Medicines:                Monitored Anesthesia Care Procedure:                Pre-Anesthesia Assessment:                           - Prior to the procedure, a History and Physical                            was performed, and patient medications and                            allergies were reviewed. The patient's tolerance of                            previous anesthesia was also reviewed. The risks                            and benefits of the procedure and the sedation                            options and risks were discussed with the patient.                            All questions were answered, and informed consent                            was obtained. Prior Anticoagulants: The patient has                            taken no previous anticoagulant or antiplatelet                            agents. ASA Grade Assessment: III - A patient with                            severe systemic disease. After reviewing the risks  and benefits, the patient was deemed in                            satisfactory condition to undergo the procedure.                           After obtaining informed consent, the endoscope was                            passed under direct vision. Throughout the                            procedure, the patient's blood pressure, pulse, and                            oxygen saturations were  monitored continuously. The                            Endoscope was introduced through the mouth, and                            advanced to the second part of duodenum. The upper                            GI endoscopy was accomplished without difficulty.                            The patient tolerated the procedure well. Scope In: Scope Out: Findings:                 Esophagogastric landmarks were identified: the                            Z-line was found at 39 cm, the gastroesophageal                            junction was found at 39 cm and the upper extent of                            the gastric folds was found at 39 cm from the                            incisors.                           The exam of the esophagus was otherwise normal.                           Mild inflammation characterized by erythema and                            friability was found in a striped pattern in the  gastric antrum.                           The exam of the stomach was otherwise normal.                           Biopsies were taken with a cold forceps in the                            gastric body, at the incisura and in the gastric                            antrum for Helicobacter pylori testing.                           The duodenal bulb and second portion of the                            duodenum were normal. Complications:            No immediate complications. Estimated blood loss:                            Minimal. Estimated Blood Loss:     Estimated blood loss was minimal. Impression:               - Esophagogastric landmarks identified.                           - Normal esophagus.                           - Antral gastritis.                           - Normal stomach otherwise - biopsies taken to rule                            out H pylori                           - Normal duodenal bulb and second portion of the                             duodenum. Recommendation:           - Patient has a contact number available for                            emergencies. The signs and symptoms of potential                            delayed complications were discussed with the                            patient. Return to normal activities tomorrow.  Written discharge instructions were provided to the                            patient.                           - Resume previous diet.                           - Continue present medications.                           - Resume omeprazole 20mg  / day as previously                            recommended                           - Await pathology results. P. Clint Strupp, MD 08/04/2019 8:42:07 AM This report has been signed electronically.

## 2019-08-04 NOTE — Progress Notes (Signed)
A and O x3. Report to RN. Tolerated MAC anesthesia well.Teeth unchanged after procedure. 

## 2019-08-04 NOTE — Progress Notes (Signed)
Vs wr

## 2019-08-04 NOTE — Patient Instructions (Signed)
Please read handouts provided. Continue present medications. Resume omeprazole 20 mg daily as previously recommended. Await pathology results.     YOU HAD AN ENDOSCOPIC PROCEDURE TODAY AT THE [facility]:   Refer to the procedure report that was given to you for any specific questions about what was found during the examination.  If the procedure report does not answer your questions, please call your gastroenterologist to clarify.  If you requested that your care partner not be given the details of your procedure findings, then the procedure report has been included in a sealed envelope for you to review at your convenience later.  YOU SHOULD EXPECT: Some feelings of bloating in the abdomen. Passage of more gas than usual.  Walking can help get rid of the air that was put into your GI tract during the procedure and reduce the bloating. If you had a lower endoscopy (such as a colonoscopy or flexible sigmoidoscopy) you may notice spotting of blood in your stool or on the toilet paper. If you underwent a bowel prep for your procedure, you may not have a normal bowel movement for a few days.  Please Note:  You might notice some irritation and congestion in your nose or some drainage.  This is from the oxygen used during your procedure.  There is no need for concern and it should clear up in a day or so.  SYMPTOMS TO REPORT IMMEDIATELY:     Following upper endoscopy (EGD)  Vomiting of blood or coffee ground material  New chest pain or pain under the shoulder blades  Painful or persistently difficult swallowing  New shortness of breath  Fever of 100F or higher  Black, tarry-looking stools  For urgent or emergent issues, a gastroenterologist can be reached at any hour by calling [phone]. Do not use MyChart messaging for urgent concerns.    DIET:  We do recommend a small meal at first, but then you may proceed to your regular diet.  Drink plenty of fluids but you should  avoid alcoholic beverages for 24 hours.  ACTIVITY:  You should plan to take it easy for the rest of today and you should NOT DRIVE or use heavy machinery until tomorrow (because of the sedation medicines used during the test).    FOLLOW UP: Our staff will call the number listed on your records 48-72 hours following your procedure to check on you and address any questions or concerns that you may have regarding the information given to you following your procedure. If we do not reach you, we will leave a message.  We will attempt to reach you two times.  During this call, we will ask if you have developed any symptoms of COVID 19. If you develop any symptoms (ie: fever, flu-like symptoms, shortness of breath, cough etc.) before then, please call [phone].  If you test positive for Covid 19 in the 2 weeks post procedure, please call and report this information to [place].    If any biopsies were taken you will be contacted by phone or by letter within the next 1-3 weeks.  Please call us at [phone] if you have not heard about the biopsies in 3 weeks.    SIGNATURES/CONFIDENTIALITY: You and/or your care partner have signed paperwork which will be entered into your electronic medical record.  These signatures attest to the fact that that the information above on your After Visit Summary has been reviewed and is understood.  Full responsibility of the confidentiality  of this discharge information lies with you and/or your care-partner.

## 2019-08-04 NOTE — Progress Notes (Signed)
Called to room to assist during endoscopic procedure.  Patient ID and intended procedure confirmed with present staff. Received instructions for my participation in the procedure from the performing physician.  

## 2019-08-06 ENCOUNTER — Telehealth: Payer: Self-pay

## 2019-08-06 NOTE — Telephone Encounter (Signed)
  Follow up Call-  Call back number 08/04/2019  Post procedure Call Back phone  # [phone]  Permission to leave phone message Yes  Some recent data might be hidden     Patient questions:  Do you have a fever, pain , or abdominal swelling? No. Pain Score  0 *  Have you tolerated food without any problems? Yes.    Have you been able to return to your normal activities? Yes.    Do you have any questions about your discharge instructions: Diet   No. Medications  No. Follow up visit  No.  Do you have questions or concerns about your Care? No.  Actions: * If pain score is 4 or above: No action needed, pain <4.   1. Have you developed a fever since your procedure? No   2.   Have you had an respiratory symptoms (SOB or cough) since your procedure? No   3.   Have you tested positive for COVID 19 since your procedure? No   4.   Have you had any family members/close contacts diagnosed with the COVID 19 since your procedure?  No    If yes to any of these questions please route to Laverna Peace, RN and Charlett Lango, RN

## 2019-08-07 ENCOUNTER — Other Ambulatory Visit: Payer: 59

## 2019-08-13 ENCOUNTER — Ambulatory Visit: Payer: 59 | Admitting: Cardiovascular Disease

## 2019-11-10 ENCOUNTER — Other Ambulatory Visit: Payer: Self-pay

## 2019-11-11 ENCOUNTER — Ambulatory Visit: Payer: 59 | Admitting: Family Medicine

## 2019-11-11 ENCOUNTER — Ambulatory Visit (INDEPENDENT_AMBULATORY_CARE_PROVIDER_SITE_OTHER): Payer: 59 | Admitting: Nurse Practitioner

## 2019-11-11 ENCOUNTER — Encounter: Payer: Self-pay | Admitting: Nurse Practitioner

## 2019-11-11 VITALS — BP 130/74 | HR 64 | Temp 97.5°F | Ht 64.5 in | Wt 279.4 lb

## 2019-11-11 DIAGNOSIS — M25511 Pain in right shoulder: Secondary | ICD-10-CM

## 2019-11-11 DIAGNOSIS — E7800 Pure hypercholesterolemia, unspecified: Secondary | ICD-10-CM

## 2019-11-11 DIAGNOSIS — E785 Hyperlipidemia, unspecified: Secondary | ICD-10-CM | POA: Diagnosis not present

## 2019-11-11 DIAGNOSIS — R102 Pelvic and perineal pain: Secondary | ICD-10-CM | POA: Diagnosis not present

## 2019-11-11 DIAGNOSIS — Z6841 Body Mass Index (BMI) 40.0 and over, adult: Secondary | ICD-10-CM

## 2019-11-11 DIAGNOSIS — Z1231 Encounter for screening mammogram for malignant neoplasm of breast: Secondary | ICD-10-CM

## 2019-11-11 DIAGNOSIS — G8929 Other chronic pain: Secondary | ICD-10-CM

## 2019-11-11 LAB — LIPID PANEL
Cholesterol: 138 mg/dL (ref 0–200)
HDL: 47.5 mg/dL (ref >39.00)
LDL Cholesterol: 69 mg/dL (ref 0–99)
NonHDL: 90.4
Total CHOL/HDL Ratio: 3
Triglycerides: 107 mg/dL (ref 0.0–149.0)
VLDL: 21.4 mg/dL (ref 0.0–40.0)

## 2019-11-11 LAB — BASIC METABOLIC PANEL
BUN: 20 mg/dL (ref 6–23)
CO2: 28 mEq/L (ref 19–32)
Calcium: 9.4 mg/dL (ref 8.4–10.5)
Chloride: 105 mEq/L (ref 96–112)
Creatinine, Ser: 0.85 mg/dL (ref 0.40–1.20)
GFR: 72.34 mL/min (ref >60.00)
Glucose, Bld: 89 mg/dL (ref 70–99)
Potassium: 4.7 mEq/L (ref 3.5–5.1)
Sodium: 141 mEq/L (ref 135–145)

## 2019-11-11 LAB — TSH: TSH: 1.67 u[IU]/mL (ref 0.35–4.50)

## 2019-11-11 LAB — HEMOGLOBIN A1C: Hgb A1c MFr Bld: 5.9 % (ref 4.6–6.5)

## 2019-11-11 NOTE — Assessment & Plan Note (Signed)
She recognizes need for weight loss. She is not interested in bariatric surgery. She has difficulty making changes for her diet and/or maintain regular exercise regimen. She agreed to referral to weight management clinic. Order entered Ordered BMP, HgbA1c, lipid panel, and TSH

## 2019-11-11 NOTE — Patient Instructions (Addendum)
Go to lab for blood draw and urine collection  You will be contacted to schedule appt for PT and with weight management clinic.  [facility]: [phone].  schedule appt with GYN for pelvic exam.

## 2019-11-11 NOTE — Progress Notes (Signed)
Subjective:    Patient ID: Gina Morris, female    DOB: [birth year], 64 y.o.   MRN: [id number]  Patient presents to establish care (new patient) and eval of chronic conditions  Shoulder Pain  The pain is present in the right shoulder. This is a chronic problem. The current episode started more than 1 year ago. There has been no history of extremity trauma. The problem occurs intermittently. The problem has been waxing and waning. The quality of the pain is described as aching and dull. Associated symptoms include stiffness. Pertinent negatives include no fever, inability to bear weight, itching, joint locking, joint swelling, limited range of motion, numbness or tingling. The symptoms are aggravated by activity. She has tried rest for the symptoms. The treatment provided significant relief. Family history does not include gout or rheumatoid arthritis. Her past medical history is significant for osteoarthritis. There is no history of diabetes, gout or rheumatoid arthritis.  Pelvic Pain The patient's primary symptoms include pelvic pain. The patient's pertinent negatives include no genital itching, genital lesions, genital odor, genital rash, missed menses, vaginal bleeding or vaginal discharge. Primary symptoms comment: pelvic pressure. This is a new problem. The current episode started 1 to 4 weeks ago. The problem occurs constantly. The problem has been unchanged. The problem affects both sides. She is not pregnant. Associated symptoms include frequency and urgency. Pertinent negatives include no abdominal pain, anorexia, back pain, chills, constipation, diarrhea, discolored urine, dysuria, fever, flank pain, headaches, hematuria, joint pain, joint swelling, nausea, painful intercourse, rash, sore throat or vomiting. Nothing aggravates the symptoms. She has tried nothing for the symptoms. She is not sexually active. She uses abstinence for contraception. She is postmenopausal.   Class 3 severe  obesity due to excess calories with serious comorbidity and body mass index (BMI) of 45.0 to 49.9 in adult [facility]) She recognizes need for weight loss. She is not interested in bariatric surgery. She has difficulty making changes for her diet and/or maintain regular exercise regimen. She agreed to referral to weight management clinic. Order entered Ordered BMP, HgbA1c, lipid panel, and TSH   Hypercholesterolemia Hx of CAD with NSTEMI Current use of lipitor. Followed by cardiology: Dr. Eden Emms once a year BP at goal. BP Readings from Last 3 Encounters:  11/11/19 130/74  08/04/19 105/66  07/17/19 130/72   Repeat lipid panel today  Diet:regular. No exercise regimen Weight:  Wt Readings from Last 3 Encounters:  11/11/19 279 lb 6.4 oz (126.7 kg)  08/04/19 (!) 278 lb (126.1 kg)  07/17/19 278 lb (126.1 kg)   Medications and allergies reviewed with patient and updated if appropriate.  Patient Active Problem List   Diagnosis Date Noted  . Class 3 severe obesity due to excess calories with serious comorbidity and body mass index (BMI) of 45.0 to 49.9 in adult (HCC) 11/11/2019  . Coronary artery disease due to lipid rich plaque 01/14/2018  . NSTEMI (non-ST elevated myocardial infarction) (HCC) 12/23/2017  . Hypercholesterolemia 12/23/2017  . Alcohol use 12/23/2017  . Baker's cyst of knee, left 04/19/2017  . Cervical radiculopathy at C7 04/19/2017  . Left leg swelling 04/19/2017  . Nonintractable headache 04/19/2017  . Seborrheic keratosis 04/19/2017  . Dyslipidemia 06/18/2012  . GERD (gastroesophageal reflux disease) 06/18/2012    Current Outpatient Medications on File Prior to Visit  Medication Sig Dispense Refill  . aspirin EC 81 MG EC tablet Take 1 tablet (81 mg total) by mouth daily. 30 tablet 0  . atorvastatin (LIPITOR) 80 MG tablet Take  1 tablet (80 mg total) by mouth daily at 6 PM. 30 tablet 0  . BORON PO Take 1 capsule by mouth in the morning and at bedtime.    . metoprolol  tartrate (LOPRESSOR) 25 MG tablet Take 1 tablet (25 mg total) by mouth 2 (two) times daily. 60 tablet 0  . nitroGLYCERIN (NITROSTAT) 0.4 MG SL tablet Place 1 tablet (0.4 mg total) under the tongue every 5 (five) minutes as needed for chest pain. 30 tablet 0  . omeprazole (PRILOSEC) 20 MG capsule Take 1 capsule (20 mg total) by mouth daily. 30 capsule 1  . Probiotic Product (PROBIOTIC PO) Take 1 capsule by mouth in the morning and at bedtime.    Marland Kitchen VITAMIN D PO Take 1 capsule by mouth daily.    Marland Kitchen VITAMIN K PO Take 1 capsule by mouth daily.      No current facility-administered medications on file prior to visit.    Past Medical History:  Diagnosis Date  . Baker's cyst of knee, left   . Coronary artery disease   . Elevated blood pressure reading 12/23/2017  . 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 GERD (gastroesophageal reflux disease)   . Morbid obesity (HCC)   . Myocardial infarction (HCC)   . Smoking greater than 10 pack years 06/18/2012  . Tobacco abuse     Past Surgical History:  Procedure Laterality Date  . CORONARY STENT INTERVENTION N/A 12/24/2017   Procedure: CORONARY STENT INTERVENTION;  Surgeon: Runell Gess, MD;  Location: [facility] IN[facility]ASIVE [facility];  Service: Cardiovascular;  Laterality: N/A;  . CORONARY STENT PLACEMENT  12/24/2017  . LEFT HEART CATH AND CORONARY ANGIOGRAPHY N/A 12/24/2017   Procedure: LEFT HEART CATH AND CORONARY ANGIOGRAPHY;  Surgeon: Runell Gess, MD;  Location: [facility] INVASIVE CV LAB;  Service: Cardiovascular;  Laterality: N/A;  . TONSILLECTOMY      Social History   Socioeconomic History  . Marital status: Single    Spouse name: Not on file  . Number of children: 1  . Years of education: Not on file  . Highest education level: Not on file  Occupational History  . Not on file  Tobacco Use  . Smoking status: Former Smoker    Packs/day: 0.50    Types: Cigarettes  . Smokeless tobacco: Never Used  . Tobacco comment: quit after heart attack 2019  Vaping  Use  . Vaping Use: Never used  Substance and Sexual Activity  . Alcohol use: Yes    Comment: wine  . Drug use: Never  . Sexual activity: Yes    Birth control/protection: Post-menopausal  Other Topics Concern  . Not on file  Social History Narrative  . Not on file   Social Determinants of Health   Financial Resource Strain:   . Difficulty of Paying Living Expenses: Not on file  Food Insecurity:   . Worried About Programme researcher, broadcasting/film/video in the Last Year: Not on file  . Ran Out of Food in the Last Year: Not on file  Transportation Needs:   . Lack of Transportation (Medical): Not on file  . Lack of Transportation (Non-Medical): Not on file  Physical Activity:   . Days of Exercise per Week: Not on file  . Minutes of Exercise per Session: Not on file  Stress:   . Feeling of Stress : Not on file  Social Connections:   . Frequency of Communication with Friends and Family: Not on file  . Frequency of Social Gatherings with Friends and  Family: Not on file  . Attends Religious Services: Not on file  . Active Member of Clubs or Organizations: Not on file  . Attends BankerClub or Organization Meetings: Not on file  . Marital Status: Not on file    Family History  Problem Relation Age of Onset  . Kidney disease Mother   . Diverticulitis Mother   . Pancreatic cancer Father   . Healthy Sister   . Diverticulitis Sister   . Uterine cancer Maternal Grandmother   . Colon cancer Neg Hx   . Esophageal cancer Neg Hx         Review of Systems  Constitutional: Negative for chills and fever.  HENT: Negative for sore throat.   Gastrointestinal: Negative for abdominal pain, anorexia, constipation, diarrhea, nausea and vomiting.  Genitourinary: Positive for frequency, pelvic pain and urgency. Negative for dysuria, flank pain, hematuria, missed menses and vaginal discharge.  Musculoskeletal: Positive for stiffness. Negative for back pain, gout and joint pain.  Skin: Negative for itching and rash.   Neurological: Negative for tingling, numbness and headaches.    Objective:   Vitals:   11/11/19 1313  BP: 130/74  Pulse: 64  Temp: (!) 97.5 F (36.4 C)  SpO2: 98%    Body mass index is 47.22 kg/m.   Physical Examination:  Physical Exam Constitutional:      Appearance: She is obese.  Cardiovascular:     Rate and Rhythm: Normal rate and regular rhythm.     Pulses: Normal pulses.     Heart sounds: Normal heart sounds.  Pulmonary:     Effort: Pulmonary effort is normal.     Breath sounds: Normal breath sounds.  Abdominal:     General: Bowel sounds are normal.     Palpations: Abdomen is soft.     Tenderness: There is no abdominal tenderness.  Musculoskeletal:     Cervical back: Normal range of motion and neck supple.     Right lower leg: Edema present.     Left lower leg: Edema present.  Lymphadenopathy:     Cervical: No cervical adenopathy.  Neurological:     Mental Status: She is alert and oriented to person, place, and time.  Psychiatric:        Mood and Affect: Mood normal.        Behavior: Behavior normal.        Thought Content: Thought content normal.    ASSESSMENT and PLAN: This visit occurred during the SARS-CoV-2 public health emergency.  Safety protocols were in place, including screening questions prior to the visit, additional usage of staff PPE, and extensive cleaning of exam room while observing appropriate contact time as indicated for disinfecting solutions.   Scarlette CalicoFrances was seen today for establish care.  Diagnoses and all orders for this visit:  Class 3 severe obesity due to excess calories with serious comorbidity and body mass index (BMI) of 45.0 to 49.9 in adult [facility](HCC) -     Basic metabolic panel -     Hemoglobin A1c -     Lipid panel -     TSH -     Amb Ref to Medical Weight Management  Chronic right shoulder pain -     Ambulatory referral to Physical Therapy  Dyslipidemia -     Lipid panel  Pelvic pressure in female -      Urinalysis w microscopic + reflex cultur  Breast cancer screening by mammogram -     MM 3D SCREEN BREAST BILATERAL; Future  Hypercholesterolemia      Problem List Items Addressed This Visit      Other   Class 3 severe obesity due to excess calories with serious comorbidity and body mass index (BMI) of 45.0 to 49.9 in adult [facility]) - Primary    She recognizes need for weight loss. She is not interested in bariatric surgery. She has difficulty making changes for her diet and/or maintain regular exercise regimen. She agreed to referral to weight management clinic. Order entered Ordered BMP, HgbA1c, lipid panel, and TSH       Relevant Orders   Basic metabolic panel   Hemoglobin A1c   Lipid panel   TSH   Amb Ref to Medical Weight Management   Dyslipidemia   Relevant Orders   Lipid panel   Hypercholesterolemia    Hx of CAD with NSTEMI Current use of lipitor. Followed by cardiology: Dr. Eden Emms once a year BP at goal. BP Readings from Last 3 Encounters:  11/11/19 130/74  08/04/19 105/66  07/17/19 130/72   Repeat lipid panel today       Other Visit Diagnoses    Chronic right shoulder pain       Relevant Orders   Ambulatory referral to Physical Therapy   Pelvic pressure in female       Relevant Orders   Urinalysis w microscopic + reflex cultur   Breast cancer screening by mammogram       Relevant Orders   MM 3D SCREEN BREAST BILATERAL      Follow up: Return in about 1 year (around 11/10/2020) for CPE (fasting).  Alysia Penna, NP

## 2019-11-11 NOTE — Assessment & Plan Note (Signed)
Hx of CAD with NSTEMI Current use of lipitor. Followed by cardiology: Dr. Eden Emms once a year BP at goal. BP Readings from Last 3 Encounters:  11/11/19 130/74  08/04/19 105/66  07/17/19 130/72   Repeat lipid panel today

## 2019-11-12 LAB — URINALYSIS W MICROSCOPIC + REFLEX CULTURE
Bacteria, UA: NONE SEEN /HPF
Bilirubin Urine: NEGATIVE
Glucose, UA: NEGATIVE
Hgb urine dipstick: NEGATIVE
Hyaline Cast: NONE SEEN /LPF
Ketones, ur: NEGATIVE
Leukocyte Esterase: NEGATIVE
Nitrites, Initial: NEGATIVE
Protein, ur: NEGATIVE
RBC / HPF: NONE SEEN /HPF (ref 0–2)
Specific Gravity, Urine: 1.019 (ref 1.001–1.03)
pH: <=5 (ref 5.0–8.0)

## 2019-11-12 LAB — NO CULTURE INDICATED

## 2020-02-01 ENCOUNTER — Encounter (INDEPENDENT_AMBULATORY_CARE_PROVIDER_SITE_OTHER): Payer: Self-pay

## 2020-02-10 NOTE — Progress Notes (Signed)
CARDIOLOGY OFFICE NOTE  Date:  02/24/2020    Gina Morris Date of Birth: May 17, 1955 Medical Record #[id number]  PCP:  Anne Ng, NP  Cardiologist:  Eden Emms  Chief Complaint  Patient presents with  . Follow-up    Seen for Dr. Eden Emms    History of Present Illness: Gina Morris is a 65 y.o. female who presents today for a follow up visit. Seen for Dr. Eden Emms. She is also seen in the AF clinic as well.   She has a history of CAD with NSTEMI 12/2017 - cath with single vessel DES to mid AV circumflex - normal EF, prior smoker, GERD, and HLD - on statin.   Last seen by Dr. Eden Emms in February of 2021 - DAPT had been stopped. Some DOE - likely from weight gain. She did stop smoking after her MI.    Comes in today. Here alone. Has just started in the weight loss clinic. Found to be in AF at her visit there - she noted palpitations associated with dizziness and fatigue.  Seen last week in the AF clinic - Eliquis was started - aspirin stopped - continued on Lopressor - has CHADSVASC of 3. Was referred for sleep study. The AF clinic notes she was in rate controlled AF - EKG however was with NSR. Had inferolateral T wave changes on the last EKG.   She feels ok. She notes that she has had short lived palpitations for several years. She has shortness of breath - she presumes this is multifactorial - from prior smoking and obesity. She has had some rare jaw pain - this is what she had with her MI. Remains short of breath with walking - going up steps. No planned follow up with the AF clinic.   Past Medical History:  Diagnosis Date  . Back pain   . Back pain   . Baker's cyst of knee, left   . Bilateral swelling of feet and ankles   . Coronary artery disease   . Elevated blood pressure reading 12/23/2017  . 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 Fatty liver   . GERD (gastroesophageal reflux disease)   . Joint pain   . Morbid obesity (HCC)   . Myocardial infarction (HCC)   . Smoking  greater than 10 pack years 06/18/2012  . Tobacco abuse     Past Surgical History:  Procedure Laterality Date  . CORONARY STENT INTERVENTION N/A 12/24/2017   Procedure: CORONARY STENT INTERVENTION;  Surgeon: Runell Gess, MD;  [facility];  Service: Cardiovascular;  Laterality: N/A;  . CORONARY STENT PLACEMENT  12/24/2017  . LEFT HEART CATH AND CORONARY ANGIOGRAPHY N/A 12/24/2017   Procedure: LEFT HEART CATH AND CORONARY ANGIOGRAPHY;  Surgeon: Runell Gess, MD;  Location: [facility];  Service: Cardiovascular;  Laterality: N/A;  . TONSILLECTOMY       Medications: Current Meds  Medication Sig  . apixaban (ELIQUIS) 5 MG TABS tablet Take 1 tablet (5 mg total) by mouth 2 (two) times daily.  Marland Kitchen atorvastatin (LIPITOR) 80 MG tablet Take 1 tablet (80 mg total) by mouth daily at 6 PM.  . metoprolol tartrate (LOPRESSOR) 25 MG tablet Take 1 tablet (25 mg total) by mouth 2 (two) times daily.  . nitroGLYCERIN (NITROSTAT) 0.4 MG SL tablet Place 1 tablet (0.4 mg total) under the tongue every 5 (five) minutes as needed for chest pain.  . Probiotic Product (PROBIOTIC PO) Take 1 capsule by mouth in the morning  and at bedtime.  Marland Kitchen VITAMIN D PO Take 1 capsule by mouth daily. 5,000 IU daily  . VITAMIN K PO Take 1 capsule by mouth daily. 5,000 daily     Allergies: Allergies  Allergen Reactions  . Penicillins Other (See Comments)    Unknown reaction  DID THE REACTION INVOLVE: Swelling of the face/tongue/throat, SOB, or low BP? Unknown Sudden or severe rash/hives, skin peeling, or the inside of the mouth or nose? Unknown Did it require medical treatment? Unknown When did it last happen? If all above answers are "NO", may proceed with cephalosporin use.     Social History: The patient  reports that she has quit smoking. Her smoking use included cigarettes. She smoked 0.50 packs per day. She has never used smokeless tobacco. She reports current alcohol use of about  2.0 - 3.0 standard drinks of alcohol per week. She reports that she does not use drugs.   Family History: The patient's family history includes Alcohol abuse in her mother; Anxiety disorder in her mother; Cancer in her father; Depression in her mother; Diverticulitis in her mother and sister; Healthy in her sister; Kidney disease in her mother; Pancreatic cancer in her father; Thyroid disease in her mother; Uterine cancer in her maternal grandmother.   Review of Systems: Please see the history of present illness.   All other systems are reviewed and negative.   Physical Exam: VS:  BP 118/80   Pulse 65   Ht 5\' 4"  (1.626 m)   Wt 280 lb 0.6 oz (127 kg)   SpO2 95%   BMI 48.07 kg/m  .  BMI Body mass index is 48.07 kg/m.  Wt Readings from Last 3 Encounters:  02/24/20 280 lb 0.6 oz (127 kg)  02/16/20 274 lb 14.6 oz (124.7 kg)  02/16/20 275 lb (124.7 kg)    General: Pleasant. Alert and in no acute distress. Obese.   Cardiac: Regular rate and rhythm. No murmurs, rubs, or gallops. No edema.  Respiratory:  Lungs are clear to auscultation bilaterally with normal work of breathing.  GI: Soft and nontender.  MS: No deformity or atrophy. Gait and ROM intact.  Skin: Warm and dry. Color is normal.  Neuro:  Strength and sensation are intact and no gross focal deficits noted.  Psych: Alert, appropriate and with normal affect.   LABORATORY DATA:  EKG:  EKG is not ordered today.  Last EKG with NSR with inferolateral T wave inversion.   Lab Results  Component Value Date   WBC 7.2 02/16/2020   HGB 14.9 02/16/2020   HCT 44.6 02/16/2020   PLT 325 02/16/2020   GLUCOSE 92 02/16/2020   CHOL 163 02/16/2020   TRIG 79 02/16/2020   HDL 53 02/16/2020   LDLCALC 95 02/16/2020   ALT 19 02/16/2020   AST 19 02/16/2020   NA 142 02/16/2020   K 4.4 02/16/2020   CL 103 02/16/2020   CREATININE 0.77 02/16/2020   BUN 16 02/16/2020   CO2 22 02/16/2020   TSH 2.170 02/16/2020   INR 0.99 12/24/2017    HGBA1C 5.6 02/16/2020     BNP (last 3 results) No results for input(s): BNP in the last 8760 hours.  ProBNP (last 3 results) No results for input(s): PROBNP in the last 8760 hours.   Other Studies Reviewed Today:  CORONARY STENT INTERVENTION 12/2017  LEFT HEART CATH AND CORONARY ANGIOGRAPHY   Conclusion    Prox Cx to Mid Cx lesion is 90% stenosed.  Ost LAD to Prox  LAD lesion is 30% stenosed.  Prox LAD to Mid LAD lesion is 30% stenosed.  A drug-eluting stent was successfully placed.  Post intervention, there is a 0% residual stenosis.    Echo Study Conclusions 12/2017  - Left ventricle: The cavity size was normal. Wall thickness was  increased in a pattern of mild LVH. Systolic function was  vigorous. The estimated ejection fraction was in the range of 65%  to 70%. Wall motion was normal; there were no regional wall  motion abnormalities. Doppler parameters are consistent with  abnormal left ventricular relaxation (grade 1 diastolic  dysfunction). The E/e&' ratio is <8, suggesting normal LV filling  pressure.  - Mitral valve: Mildly thickened leaflets . There was mild  regurgitation.  - Left atrium: The atrium was mildly dilated.  - Right atrium: The atrium was mildly dilated.  - Tricuspid valve: There was mild regurgitation.  - Pulmonary arteries: PA peak pressure: 27 mm Hg (S).  - Inferior vena cava: The vessel was normal in size. The  respirophasic diameter changes were in the normal range (>= 50%),  consistent with normal central venous pressure.   Impressions:   - LVEF 65-70%, mild LVH, normal wall motion, grade 1 DD, normal LV  filling pressure, mild MR, mild biatrial enlargement, mild TR,  RVSP 27 mmHg, normal IVC.    ASSESSMENT AND PLAN:   1. New onset AF - back in NSR - sleep study ordered - now on Eliquis. Will need lab on return.    2. CAD - prior MI with DES to [facility] for single vessel disease - now off aspirin and has  started Eliquis per the AF clinic - noted inferolateral T wave inversion on last EKG - has had some fleeting jaw pain endorsed - will arrange for Myoview - further disposition to follow.  [id number]} The risks [chest pain, shortness of breath, cardiac arrhythmias, dizziness, blood pressure fluctuations, myocardial infarction, stroke/transient ischemic attack, nausea, vomiting, allergic reaction, radiation exposure, metallic taste sensation and life-threatening complications (estimated to be 1 in 10,000)], benefits (risk stratification, diagnosing coronary artery disease, treatment guidance) and alternatives of a nuclear stress test were discussed in detail with Gina Morris and she agrees to proceed.  3. HTN - BP is fine. No changes made.  4. HLD - labs from last week noted.   5. Obesity - now in the Weight Loss clinic - she seems motivated to make changes.   6. Concern for OSA - sleep study ordered by AF clinic  7. Prior smoker - not smoking.   Current medicines are reviewed with the patient today.  The patient does not have concerns regarding medicines other than what has been noted above.  The following changes have been made:  See above.  Labs/ tests ordered today include:    Orders Placed This Encounter  Procedures  . Cardiac Stress Test: Informed Consent Details: Physician/Practitioner Attestation; Transcribe to consent form and obtain patient signature     Disposition:   FU with Dr.Nishan in about a month with repeat surveillance labs - this may end up being a virtual visit but she will need follow up labs. Myoview ordered. Will continue with her current regimen.    Patient is agreeable to this plan and will call if any problems develop in the interim.   SignedNorma Fredrickson, NP  02/24/2020 8:39 AM  [facility] [address] [place], [place]  [address] Phone: [phone] Fax: [phone]

## 2020-02-16 ENCOUNTER — Encounter (HOSPITAL_COMMUNITY): Payer: Self-pay | Admitting: Physician Assistant

## 2020-02-16 ENCOUNTER — Ambulatory Visit (INDEPENDENT_AMBULATORY_CARE_PROVIDER_SITE_OTHER): Payer: 59 | Admitting: Family Medicine

## 2020-02-16 ENCOUNTER — Ambulatory Visit (HOSPITAL_COMMUNITY)
Admission: RE | Admit: 2020-02-16 | Discharge: 2020-02-16 | Disposition: A | Discharge status: Home / Self Care | Payer: 59 | Source: Ambulatory Visit | Attending: Physician Assistant | Admitting: Physician Assistant

## 2020-02-16 ENCOUNTER — Other Ambulatory Visit: Payer: Self-pay

## 2020-02-16 ENCOUNTER — Telehealth: Payer: Self-pay | Admitting: Cardiovascular Disease

## 2020-02-16 ENCOUNTER — Encounter (INDEPENDENT_AMBULATORY_CARE_PROVIDER_SITE_OTHER): Payer: Self-pay | Admitting: Family Medicine

## 2020-02-16 VITALS — BP 100/69 | HR 92 | Temp 97.7°F | Ht 62.0 in | Wt 275.0 lb

## 2020-02-16 VITALS — BP 128/74 | HR 63 | Ht 62.0 in | Wt 274.9 lb

## 2020-02-16 DIAGNOSIS — E785 Hyperlipidemia, unspecified: Secondary | ICD-10-CM | POA: Insufficient documentation

## 2020-02-16 DIAGNOSIS — D6869 Other thrombophilia: Secondary | ICD-10-CM | POA: Insufficient documentation

## 2020-02-16 DIAGNOSIS — Z9189 Other specified personal risk factors, not elsewhere classified: Secondary | ICD-10-CM

## 2020-02-16 DIAGNOSIS — R7303 Prediabetes: Secondary | ICD-10-CM

## 2020-02-16 DIAGNOSIS — Z6841 Body Mass Index (BMI) 40.0 and over, adult: Secondary | ICD-10-CM | POA: Insufficient documentation

## 2020-02-16 DIAGNOSIS — Z7982 Long term (current) use of aspirin: Secondary | ICD-10-CM | POA: Diagnosis not present

## 2020-02-16 DIAGNOSIS — R0683 Snoring: Secondary | ICD-10-CM | POA: Insufficient documentation

## 2020-02-16 DIAGNOSIS — R0602 Shortness of breath: Secondary | ICD-10-CM

## 2020-02-16 DIAGNOSIS — R5383 Other fatigue: Secondary | ICD-10-CM

## 2020-02-16 DIAGNOSIS — I4891 Unspecified atrial fibrillation: Secondary | ICD-10-CM

## 2020-02-16 DIAGNOSIS — I2510 Atherosclerotic heart disease of native coronary artery without angina pectoris: Secondary | ICD-10-CM | POA: Insufficient documentation

## 2020-02-16 DIAGNOSIS — E669 Obesity, unspecified: Secondary | ICD-10-CM | POA: Insufficient documentation

## 2020-02-16 DIAGNOSIS — Z880 Allergy status to penicillin: Secondary | ICD-10-CM | POA: Insufficient documentation

## 2020-02-16 DIAGNOSIS — Z87891 Personal history of nicotine dependence: Secondary | ICD-10-CM | POA: Insufficient documentation

## 2020-02-16 DIAGNOSIS — Z1331 Encounter for screening for depression: Secondary | ICD-10-CM | POA: Diagnosis not present

## 2020-02-16 DIAGNOSIS — I2583 Coronary atherosclerosis due to lipid rich plaque: Secondary | ICD-10-CM

## 2020-02-16 DIAGNOSIS — Z79899 Other long term (current) drug therapy: Secondary | ICD-10-CM | POA: Insufficient documentation

## 2020-02-16 DIAGNOSIS — E119 Type 2 diabetes mellitus without complications: Secondary | ICD-10-CM | POA: Diagnosis not present

## 2020-02-16 DIAGNOSIS — K760 Fatty (change of) liver, not elsewhere classified: Secondary | ICD-10-CM | POA: Diagnosis not present

## 2020-02-16 DIAGNOSIS — I480 Paroxysmal atrial fibrillation: Secondary | ICD-10-CM

## 2020-02-16 DIAGNOSIS — Z0289 Encounter for other administrative examinations: Secondary | ICD-10-CM

## 2020-02-16 MED ORDER — DABIGATRAN ETEXILATE MESYLATE 150 MG PO CAPS
150.0000 mg | ORAL_CAPSULE | Freq: Two times a day (BID) | ORAL | 0 refills | Status: DC
Start: 2020-02-16 — End: 2020-02-16

## 2020-02-16 MED ORDER — APIXABAN 5 MG PO TABS
5.0000 mg | ORAL_TABLET | Freq: Two times a day (BID) | ORAL | 3 refills | Status: DC
Start: 2020-02-16 — End: 2020-07-27

## 2020-02-16 NOTE — Patient Instructions (Signed)
Stop aspirin  Start Eliquis 5mg  twice a day  Referral for sleep study placed. Scheduling will call once insurance approval received.

## 2020-02-16 NOTE — Telephone Encounter (Signed)
Dr. Lawson Radar with [facility] is calling to have someone look at this patient's EKG with her. She believes patient may be in afib and may need to be seen sooner. Has appt with Norma Fredrickson on 02/24/20.

## 2020-02-16 NOTE — Progress Notes (Signed)
Primary Care Physician: Anne Ng, NP Primary Cardiologist: Dr Eden Emms Primary Electrophysiologist: none Referring Physician: Dr Ross/Dr Lawson Radar   Gina Morris is a 65 y.o. female with a history of CAD, DM, HLD, and tobacco abuse who presents for consultation in the [facility].  The patient was initially diagnosed with atrial fibrillation 02/16/20 at her appointment with Dr Lawson Radar with [facility]. Patient has a CHADS2VASC score of 3 (will be 4 when she turns 65 in April). She reports that she has had rare and brief palpitations for a long time but nothing sustained until 02/15/20 when she had palpitations associated with dizziness and fatigue. ECG this morning showed rate controlled afib. She does admit to snoring and daytime somnolence. She had a sleep study >10 years ago which showed borderline OSA per patient report.   Today, she denies symptoms of chest pain, shortness of breath, orthopnea, PND, lower extremity edema, presyncope, syncope, bleeding, or neurologic sequela. The patient is tolerating medications without difficulties and is otherwise without complaint today.    Atrial Fibrillation Risk Factors:  she does have symptoms or diagnosis of sleep apnea. she does not have a history of rheumatic fever. she does not have a history of alcohol use. The patient does not have a history of early familial atrial fibrillation or other arrhythmias.  she has a BMI of Body mass index is 50.28 kg/m.Marland Kitchen Filed Weights   02/16/20 1520  Weight: 124.7 kg    Family History  Problem Relation Age of Onset  . Kidney disease Mother   . Diverticulitis Mother   . Thyroid disease Mother   . Depression Mother   . Anxiety disorder Mother   . Alcohol abuse Mother   . Pancreatic cancer Father   . Cancer Father   . Healthy Sister   . Diverticulitis Sister   . Uterine cancer Maternal Grandmother   . Colon cancer Neg Hx   . Esophageal cancer Neg Hx       Atrial Fibrillation Management history:  Previous antiarrhythmic drugs: none Previous cardioversions: none Previous ablations: none CHADS2VASC score: 3 Anticoagulation history: none   Past Medical History:  Diagnosis Date  . Back pain   . Back pain   . Baker's cyst of knee, left   . Bilateral swelling of feet and ankles   . Coronary artery disease   . Elevated blood pressure reading 12/23/2017  . 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 Fatty liver   . GERD (gastroesophageal reflux disease)   . Joint pain   . Morbid obesity (HCC)   . Myocardial infarction (HCC)   . Smoking greater than 10 pack years 06/18/2012  . Tobacco abuse    Past Surgical History:  Procedure Laterality Date  . CORONARY STENT INTERVENTION N/A 12/24/2017   Procedure: CORONARY STENT INTERVENTION;  Surgeon: Runell Gess, MD;  [facility];  Service: Cardiovascular;  Laterality: N/A;  . CORONARY STENT PLACEMENT  12/24/2017  . LEFT HEART CATH AND CORONARY ANGIOGRAPHY N/A 12/24/2017   Procedure: LEFT HEART CATH AND CORONARY ANGIOGRAPHY;  Surgeon: Runell Gess, MD;  [facility]: [facility];  Service: Cardiovascular;  Laterality: N/A;  . TONSILLECTOMY      Current Outpatient Medications  Medication Sig Dispense Refill  . aspirin EC 81 MG EC tablet Take 1 tablet (81 mg total) by mouth daily. 30 tablet 0  . atorvastatin (LIPITOR) 80 MG tablet Take 1 tablet (80 mg total) by mouth daily at 6 PM.  30 tablet 0  . dabigatran (PRADAXA) 150 MG CAPS capsule Take 1 capsule (150 mg total) by mouth 2 (two) times daily. 60 capsule 0  . metoprolol tartrate (LOPRESSOR) 25 MG tablet Take 1 tablet (25 mg total) by mouth 2 (two) times daily. 60 tablet 0  . nitroGLYCERIN (NITROSTAT) 0.4 MG SL tablet Place 1 tablet (0.4 mg total) under the tongue every 5 (five) minutes as needed for chest pain. 30 tablet 0  . Probiotic Product (PROBIOTIC PO) Take 1 capsule by mouth in the morning and at bedtime.    Marland Kitchen  VITAMIN D PO Take 1 capsule by mouth daily. 5,000 IU daily    . VITAMIN K PO Take 1 capsule by mouth daily. 5,000 daily     No current facility-administered medications for this encounter.    Allergies  Allergen Reactions  . Penicillins Other (See Comments)    Unknown reaction  DID THE REACTION INVOLVE: Swelling of the face/tongue/throat, SOB, or low BP? Unknown Sudden or severe rash/hives, skin peeling, or the inside of the mouth or nose? Unknown Did it require medical treatment? Unknown When did it last happen? If all above answers are "NO", may proceed with cephalosporin use.     Social History   Socioeconomic History  . Marital status: Single    Spouse name: Not on file  . Number of children: 1  . Years of education: Not on file  . Highest education level: Not on file  Occupational History  . Occupation: Water quality scientist  Tobacco Use  . Smoking status: Former Smoker    Packs/day: 0.50    Types: Cigarettes  . Smokeless tobacco: Never Used  . Tobacco comment: quit after heart attack 2019  Vaping Use  . Vaping Use: Never used  Substance and Sexual Activity  . Alcohol use: Yes    Alcohol/week: 2.0 - 3.0 standard drinks    Types: 1 Glasses of wine, 1 - 2 Cans of beer per week    Comment: wine  . Drug use: Never  . Sexual activity: Yes    Birth control/protection: Post-menopausal  Other Topics Concern  . Not on file  Social History Narrative  . Not on file   Social Determinants of Health   Financial Resource Strain: Not on file  Food Insecurity: Not on file  Transportation Needs: Not on file  Physical Activity: Not on file  Stress: Not on file  Social Connections: Not on file  Intimate Partner Violence: Not on file     ROS- All systems are reviewed and negative except as per the HPI above.  Physical Exam: Vitals:   02/16/20 1520  BP: 128/74  Pulse: 63  Weight: 124.7 kg  Height: 5\' 2"  (1.575 m)    GEN- The patient is well appearing obese  female, alert and oriented x 3 today.   Head- normocephalic, atraumatic Eyes-  Sclera clear, conjunctiva pink Ears- hearing intact Oropharynx- clear Neck- supple  Lungs- Clear to ausculation bilaterally, normal work of breathing Heart- Regular rate and rhythm, no murmurs, rubs or gallops  GI- soft, NT, ND, + BS Extremities- no clubbing, cyanosis, or edema MS- no significant deformity or atrophy Skin- no rash or lesion Psych- euthymic mood, full affect Neuro- strength and sensation are intact  Wt Readings from Last 3 Encounters:  02/16/20 124.7 kg  02/16/20 124.7 kg  11/11/19 126.7 kg    EKG today demonstrates  SR Vent. rate 63 BPM PR interval 138 ms QRS duration 70 ms QT/QTc 380/388  ms  Echo 12/23/17 demonstrated  Left ventricle: The cavity size was normal. Wall thickness was  increased in a pattern of mild LVH. Systolic function was  vigorous. The estimated ejection fraction was in the range of 65%  to 70%. Wall motion was normal; there were no regional wall  motion abnormalities. Doppler parameters are consistent with  abnormal left ventricular relaxation (grade 1 diastolic  dysfunction). The E/e&' ratio is <8, suggesting normal LV filling  pressure.  - Mitral valve: Mildly thickened leaflets . There was mild  regurgitation.  - Left atrium: The atrium was mildly dilated.  - Right atrium: The atrium was mildly dilated.  - Tricuspid valve: There was mild regurgitation.  - Pulmonary arteries: PA peak pressure: 27 mm Hg (S).  - Inferior vena cava: The vessel was normal in size. The  respirophasic diameter changes were in the normal range (>= 50%),  consistent with normal central venous pressure.   Impressions:   - LVEF 65-70%, mild LVH, normal wall motion, grade 1 DD, normal LV  filling pressure, mild MR, mild biatrial enlargement, mild TR,  RVSP 27 mmHg, normal IVC.   Epic records are reviewed at length today  CHA2DS2-VASc Score = 3  The  patient's score is based upon: CHF History: No HTN History: No Diabetes History: Yes Stroke History: No Vascular Disease History: Yes Age Score: 0 Gender Score: 1      ASSESSMENT AND PLAN: 1. New onset atrial fibrillation  The patient's CHA2DS2-VASc score is 3, indicating a 3.2% annual risk of stroke.   General education about afib provided and questions answered. We also discussed her stroke risk and the risks and benefits of anticoagulation. Patient back in SR at visit today. Will start Eliquis 5 mg BID. Stop ASA. Continue Lopressor 25 mg BID  2. Secondary Hypercoagulable State (ICD10:  D68.69) The patient is at significant risk for stroke/thromboembolism based upon her CHA2DS2-VASc Score of 3.  Start Apixaban (Eliquis).   3. Obesity Body mass index is 50.28 kg/m. Lifestyle modification was discussed at length including regular exercise and weight reduction.  4. Snoring/daytime somnolence  The importance of adequate treatment of sleep apnea was discussed today in order to improve our ability to maintain sinus rhythm long term. Will refer for sleep study.   5. CAD No anginal symptoms.   Follow up with Norma Fredrickson as scheduled. AF clinic as needed.    Jorja Loa PA-C Afib Clinic [facility] [address] [place], [place] [address] [phone] 02/16/2020 3:29 PM

## 2020-02-16 NOTE — Telephone Encounter (Addendum)
Dr. Tenny Craw spoke with the pts PCP Dr. Lawson Radar re: new Afib... pt to have an appt in the Afib Clinic today at 3 pm... pt advised and verbalized understanding.

## 2020-02-17 LAB — CBC WITH DIFFERENTIAL/PLATELET
Basophils Absolute: 0.1 10*3/uL (ref 0.0–0.2)
Basos: 1 %
EOS (ABSOLUTE): 0.2 10*3/uL (ref 0.0–0.4)
Eos: 2 %
Hematocrit: 44.6 % (ref 34.0–46.6)
Hemoglobin: 14.9 g/dL (ref 11.1–15.9)
Immature Grans (Abs): 0 10*3/uL (ref 0.0–0.1)
Immature Granulocytes: 0 %
Lymphocytes Absolute: 3 10*3/uL (ref 0.7–3.1)
Lymphs: 42 %
MCH: 31 pg (ref 26.6–33.0)
MCHC: 33.4 g/dL (ref 31.5–35.7)
MCV: 93 fL (ref 79–97)
Monocytes Absolute: 0.4 10*3/uL (ref 0.1–0.9)
Monocytes: 6 %
Neutrophils Absolute: 3.5 10*3/uL (ref 1.4–7.0)
Neutrophils: 49 %
Platelets: 325 10*3/uL (ref 150–450)
RBC: 4.81 x10E6/uL (ref 3.77–5.28)
RDW: 12.5 % (ref 11.7–15.4)
WBC: 7.2 10*3/uL (ref 3.4–10.8)

## 2020-02-17 LAB — HEMOGLOBIN A1C
Est. average glucose Bld gHb Est-mCnc: 114 mg/dL
Hgb A1c MFr Bld: 5.6 % (ref 4.8–5.6)

## 2020-02-17 LAB — LIPID PANEL WITH LDL/HDL RATIO
Cholesterol, Total: 163 mg/dL (ref 100–199)
HDL: 53 mg/dL (ref >39)
LDL Chol Calc (NIH): 95 mg/dL (ref 0–99)
LDL/HDL Ratio: 1.8 ratio (ref 0.0–3.2)
Triglycerides: 79 mg/dL (ref 0–149)
VLDL Cholesterol Cal: 15 mg/dL (ref 5–40)

## 2020-02-17 LAB — T4: T4, Total: 9.7 ug/dL (ref 4.5–12.0)

## 2020-02-17 LAB — T3: T3, Total: 161 ng/dL (ref 71–180)

## 2020-02-17 LAB — COMPREHENSIVE METABOLIC PANEL
ALT: 19 IU/L (ref 0–32)
AST: 19 IU/L (ref 0–40)
Albumin/Globulin Ratio: 1.8 (ref 1.2–2.2)
Albumin: 4.3 g/dL (ref 3.8–4.8)
Alkaline Phosphatase: 120 IU/L (ref 44–121)
BUN/Creatinine Ratio: 21 (ref 12–28)
BUN: 16 mg/dL (ref 8–27)
Bilirubin Total: 0.5 mg/dL (ref 0.0–1.2)
CO2: 22 mmol/L (ref 20–29)
Calcium: 9.6 mg/dL (ref 8.7–10.3)
Chloride: 103 mmol/L (ref 96–106)
Creatinine, Ser: 0.77 mg/dL (ref 0.57–1.00)
GFR calc Af Amer: 94 mL/min/{1.73_m2} (ref >59)
GFR calc non Af Amer: 82 mL/min/{1.73_m2} (ref >59)
Globulin, Total: 2.4 g/dL (ref 1.5–4.5)
Glucose: 92 mg/dL (ref 65–99)
Potassium: 4.4 mmol/L (ref 3.5–5.2)
Sodium: 142 mmol/L (ref 134–144)
Total Protein: 6.7 g/dL (ref 6.0–8.5)

## 2020-02-17 LAB — TSH: TSH: 2.17 u[IU]/mL (ref 0.450–4.500)

## 2020-02-17 LAB — INSULIN, RANDOM: INSULIN: 15.5 u[IU]/mL (ref 2.6–24.9)

## 2020-02-17 LAB — VITAMIN B12: Vitamin B-12: 479 pg/mL (ref 232–1245)

## 2020-02-17 LAB — FOLATE: Folate: 15.5 ng/mL (ref >3.0)

## 2020-02-17 LAB — VITAMIN D 25 HYDROXY (VIT D DEFICIENCY, FRACTURES): Vit D, 25-Hydroxy: 32.9 ng/mL (ref 30.0–100.0)

## 2020-02-24 ENCOUNTER — Other Ambulatory Visit: Payer: Self-pay

## 2020-02-24 ENCOUNTER — Ambulatory Visit (INDEPENDENT_AMBULATORY_CARE_PROVIDER_SITE_OTHER): Payer: 59 | Admitting: Nurse Practitioner

## 2020-02-24 ENCOUNTER — Encounter: Payer: Self-pay | Admitting: Nurse Practitioner

## 2020-02-24 VITALS — BP 118/80 | HR 65 | Ht 64.0 in | Wt 280.0 lb

## 2020-02-24 DIAGNOSIS — R0600 Dyspnea, unspecified: Secondary | ICD-10-CM

## 2020-02-24 DIAGNOSIS — I214 Non-ST elevation (NSTEMI) myocardial infarction: Secondary | ICD-10-CM | POA: Diagnosis not present

## 2020-02-24 DIAGNOSIS — R9431 Abnormal electrocardiogram [ECG] [EKG]: Secondary | ICD-10-CM

## 2020-02-24 DIAGNOSIS — E785 Hyperlipidemia, unspecified: Secondary | ICD-10-CM | POA: Diagnosis not present

## 2020-02-24 DIAGNOSIS — I259 Chronic ischemic heart disease, unspecified: Secondary | ICD-10-CM | POA: Diagnosis not present

## 2020-02-24 DIAGNOSIS — I480 Paroxysmal atrial fibrillation: Secondary | ICD-10-CM

## 2020-02-24 NOTE — Patient Instructions (Addendum)
After Visit Summary:  We will be checking the following labs today - NONE   Medication Instructions:    Continue with your current medicines.    If you need a refill on your cardiac medications before your next appointment, please call your pharmacy.     Testing/Procedures To Be Arranged:  Lexiscan Myoview  You are scheduled for a Myocardial Perfusion Imaging Study on ________________________________ at ____________________________.   Please arrive 15 minutes prior to your appointment time for registration and insurance purposes.   The test will take approximately 3 to 4 hours to complete; you may bring reading material. If someone comes with you to your appointment, they will need to remain in the main lobby due to limited space in the testing area.   How to prepare for your Myocardial Perfusion test:   Do not eat or drink 3 hours prior to your test, except you may have water.    Do not consume products containing caffeine (regular or decaffeinated) 12 hours prior to your test (ex: coffee, chocolate, soda, tea)   Do bring a list of your current medications with you. If not listed below, you may take your medications as normal.    Bring any held medication to your appointment, as you may be required to take it once the test is complete.   Do wear comfortable clothes (no dresses or overalls) and walking shoes. Tennis shoes are preferred. No heels or open toed shoes.  Do not wear cologne, perfume, aftershave or lotions (deodorant is allowed).   If these instructions are not followed, you test will have to be rescheduled.   Please report to [address] for your test. If you have questions or concerns about your appointment, please call the Nuclear Lab at #[phone].  If you cannot keep your appointment, please provide 24 hour notification to the Nuclear lab to avoid a possible $50 charge to your account.     Follow-Up:   See Dr. Eden Emms in about  4 weeks - plan to have repeat labs with that visit - do not need to fast    At [facility], you and your health needs are our priority.  As part of our continuing mission to provide you with exceptional heart care, we have created designated Provider Care Teams.  These Care Teams include your primary Cardiologist (physician) and Advanced Practice Providers (APPs -  Physician Assistants and Nurse Practitioners) who all work together to provide you with the care you need, when you need it.  Special Instructions:  . Stay safe, wash your hands for at least 20 seconds and wear a mask when needed.  . It was good to talk with you today.    Call the [facility] office at [phone] if you have any questions, problems or concerns.

## 2020-02-25 NOTE — Progress Notes (Unsigned)
Dear Gina Penna, NP,   Thank you for referring Gina Morris to our clinic. The following note includes my evaluation and treatment recommendations.  Chief Complaint:   OBESITY Gina Morris (MR# [id number]) is a 65 y.o. female who presents for evaluation and treatment of obesity and related comorbidities. Current BMI is Body mass index is 50.3 kg/m. Gina Morris has been struggling with her weight for many years and has been unsuccessful in either losing weight, maintaining weight loss, or reaching her healthy weight goal.  Gina Morris is currently in the action stage of change and ready to dedicate time achieving and maintaining a healthier weight. Gina Morris is interested in becoming our patient and working on intensive lifestyle modifications including (but not limited to) diet and exercise for weight loss.  Gina Morris was referred by Gina Penna, NP. She reports weight gain more in the last 3 years. She has breakfast out daily at [employer]- egg McMuffin and coffee with cream and splenda (feel satisfied); no snacks in the AM; Lunch- chicken wrap at [employer] or salad from [facility] with unsweet iced tea (satisfied). If she is eating late, she won't eat dinner. Dinner- around 7-8 PM, chicken dish or grilled chicken (5 oz) or fish with salad or 1 cup of rice and 1 cup vegetables (too full).  Gina Morris's habits were reviewed today and are as follows: Her family eats meals together, she thinks her family will eat healthier with her, her desired weight loss is 125 lbs, she has been heavy most of her life, she started gaining weight 3 years ago, her heaviest weight ever was 280 pounds, she has significant food cravings issues, she skips meals frequently, she is frequently drinking liquids with calories, she frequently makes poor food choices, she has problems with excessive hunger, she frequently eats larger portions than normal, she has binge eating behaviors and she struggles with emotional  eating.  Depression Screen [name]'s Food and Mood (modified PHQ-9) score was 11.  Depression screen [facility] 2/9 02/16/2020  Decreased Interest 3  Down, Depressed, Hopeless 1  PHQ - 2 Score 4  Altered sleeping 2  Tired, decreased energy 3  Change in appetite 1  Feeling bad or failure about yourself  1  Trouble concentrating 0  Moving slowly or fidgety/restless 0  Suicidal thoughts 0  PHQ-9 Score 11  Difficult doing work/chores Not difficult at all   Subjective:   1. Other fatigue Gina Morris admits to daytime somnolence and admits to waking up still tired. Patent has a history of symptoms of daytime fatigue. Gina Morris generally gets 6-8 hours of sleep per night, and states that she has generally restful sleep. Snoring is present. Apneic episodes are not present. Epworth Sleepiness Score is 3. EKG- irregularity irregular heart rate with 1 PVC. Pt is on daily aspirin. She has a history of heart stent.  2. SOB (shortness of breath) on exertion Gina Morris notes increasing shortness of breath with exercising and seems to be worsening over time with weight gain. She notes getting out of breath sooner with activity than she used to. This has gotten worse recently. Gina Morris denies shortness of breath at rest or orthopnea.  EKG- irregularity irregular heart rate with 1 PVC. Pt is on daily aspirin. She has a history of heart stent.  3. Coronary artery disease due to lipid rich plaque Gina Morris has a history of stent placement. She sees Dr. Eden Emms.  4. NAFLD (nonalcoholic fatty liver disease) Gina Morris was diagnosed with NAFLD years ago. She believes she has had an  ultrasound.  5. Pre-diabetes Pt's last HgA1c was 5.9 in November 2021.   Ref. Range 11/11/2019 13:56  Hemoglobin A1C Latest Ref Range: 4.6 - 6.5 % 5.9   6. Atrial fibrillation, unspecified type [facility](HCC) New diagnosis- seen on EKG. Pt is symptomatic. Vital signs are within  Normal limits. Given her cardiac history, I phoned [facility] to discuss  the new onset a-fib with Dr. Tenny Crawoss, who agreed with diagnosis and will get the pt set up with cardiology appointment today. She has a history of CAD. An Echo in 2019 showing Grade I diastolic dysfunction.  7. Dyslipidemia Gina Morris is on statin therapy. No transaminitis.  Assessment/Plan:   1. Other fatigue Gina Morris does feel that her weight is causing her energy to be lower than it should be. Fatigue may be related to obesity, depression or many other causes. Labs will be ordered, and in the meanwhile, Gina Morris will focus on self care including making healthy food choices, increasing physical activity and focusing on stress reduction. Check labs today.  - EKG 12-Lead - Vitamin B12 - CBC with Differential/Platelet - Folate - T3 - T4 - TSH - VITAMIN D 25 Hydroxy (Vit-D Deficiency, Fractures)  2. SOB (shortness of breath) on exertion Gina Morris does feel that she gets out of breath more easily that she used to when she exercises. [name]'s shortness of breath appears to be obesity related and exercise induced. She has agreed to work on weight loss and gradually increase exercise to treat her exercise induced shortness of breath. Will continue to monitor closely.  3. Coronary artery disease due to lipid rich plaque Continue current medications.  4. NAFLD (nonalcoholic fatty liver disease) We discussed the likely diagnosis of non-alcoholic fatty liver disease today and how this condition is obesity related. Gina Morris was educated the importance of weight loss. Gina Morris agreed to continue with her weight loss efforts with healthier diet and exercise as an essential part of her treatment plan. Check labs today.  - Comprehensive metabolic panel  5. Pre-diabetes Gina Morris will continue to work on weight loss, exercise, and decreasing simple carbohydrates to help decrease the risk of diabetes. Check labs today.  - Hemoglobin A1c - Insulin, random  6. Atrial fibrillation, unspecified type (HCC) Start  Pradaxa 150 mg BID #60 with  No refills. Cardiology is to set up appointment today for A. Fib clinic.  7. Dyslipidemia Cardiovascular risk and specific lipid/LDL goals reviewed.  We discussed several lifestyle modifications today and Gina Morris will continue to work on diet, exercise and weight loss efforts. Orders and follow up as documented in patient record. Check labs today.  Counseling Intensive lifestyle modifications are the first line treatment for this issue. . Dietary changes: Increase soluble fiber. Decrease simple carbohydrates. . Exercise changes: Moderate to vigorous-intensity aerobic activity 150 minutes per week if tolerated. . Lipid-lowering medications: see documented in medical record.  - Lipid Panel With LDL/HDL Ratio  8. Depression screen Gina Morris had a positive depression screening. Depression is commonly associated with obesity and often results in emotional eating behaviors. We will monitor this closely and work on CBT to help improve the non-hunger eating patterns. Referral to Psychology may be required if no improvement is seen as she continues in our clinic.  9. Class 3 severe obesity due to excess calories with serious comorbidity and body mass index (BMI) of 45.0 to 49.9 in adult [facility](HCC) Gina Morris is currently in the action stage of change and her goal is to continue with weight loss efforts. I recommend Gina Morris begin  the structured treatment plan as follows:  She has agreed to the Category 2 Plan.  Exercise goals: No exercise has been prescribed at this time.   Behavioral modification strategies: increasing lean protein intake, meal planning and cooking strategies, keeping healthy foods in the home and planning for success.  She was informed of the importance of frequent follow-up visits to maximize her success with intensive lifestyle modifications for her multiple health conditions. She was informed we would discuss her lab results at her next visit unless there is a  critical issue that needs to be addressed sooner. Gina Morris agreed to keep her next visit at the agreed upon time to discuss these results.  Objective:   Blood pressure 100/69, pulse 92, temperature 97.7 F (36.5 C), temperature source Oral, height 5\' 2"  (1.575 m), weight 275 lb (124.7 kg), SpO2 97 %. Body mass index is 50.3 kg/m.  EKG: Normal sinus rhythm, rate 88.  Indirect Calorimeter completed today shows a VO2 of 203 and a REE of 1414.  Her calculated basal metabolic rate is thus her basal metabolic rate is worse than expected.  General: Cooperative, alert, well developed, in no acute distress. HEENT: Conjunctivae and lids unremarkable. Cardiovascular: Regular rhythm.  Lungs: Normal work of breathing. Neurologic: No focal deficits.   Lab Results  Component Value Date   CREATININE 0.77 02/16/2020   BUN 16 02/16/2020   NA 142 02/16/2020   K 4.4 02/16/2020   CL 103 02/16/2020   CO2 22 02/16/2020   Lab Results  Component Value Date   ALT 19 02/16/2020   AST 19 02/16/2020   ALKPHOS 120 02/16/2020   BILITOT 0.5 02/16/2020   Lab Results  Component Value Date   HGBA1C 5.6 02/16/2020   HGBA1C 5.9 11/11/2019   HGBA1C 5.2 12/23/2017   Lab Results  Component Value Date   INSULIN 15.5 02/16/2020   Lab Results  Component Value Date   TSH 2.170 02/16/2020   Lab Results  Component Value Date   CHOL 163 02/16/2020   HDL 53 02/16/2020   LDLCALC 95 02/16/2020   TRIG 79 02/16/2020   CHOLHDL 3 11/11/2019   Lab Results  Component Value Date   WBC 7.2 02/16/2020   HGB 14.9 02/16/2020   HCT 44.6 02/16/2020   MCV 93 02/16/2020   PLT 325 02/16/2020    Attestation Statements:   Reviewed by clinician on day of visit: allergies, medications, problem list, medical history, surgical history, family history, social history, and previous encounter notes.  Time spent on visit including pre-visit chart review and post-visit charting and care was 60 minutes.   02/18/2020, am acting as transcriptionist for Edmund Hilda, MD.   This is the patient's first visit at [facility]. The patient's NEW PATIENT PACKET was reviewed at length. Included in the packet: current and past health history, medications, allergies, ROS, gynecologic history (women only), surgical history, family history, social history, weight history, weight loss surgery history (for those that have had weight loss surgery), nutritional evaluation, mood and food questionnaire, PHQ9, Epworth questionnaire, sleep habits questionnaire, patient life and health improvement goals questionnaire. These will all be scanned into the patient's chart under media.   During the visit, I independently reviewed the patient's EKG, bioimpedance scale results, and indirect calorimeter results. I used this information to tailor a meal plan for the patient that will help her to lose weight and will improve her obesity-related conditions going forward. I performed a medically necessary appropriate  examination and/or evaluation. I discussed the assessment and treatment plan with the patient. The patient was provided an opportunity to ask questions and all were answered. The patient agreed with the plan and demonstrated an understanding of the instructions. Labs were ordered at this visit and will be reviewed at the next visit unless more critical results need to be addressed immediately. Clinical information was updated and documented in the EMR.   Time spent on visit including pre-visit chart review and post-visit care was 65 minutes.   A separate 15 minutes was spent on risk counseling (see above).  I have reviewed the above documentation for accuracy and completeness, and I agree with the above. - Katherina Mires, MD

## 2020-03-01 ENCOUNTER — Encounter (INDEPENDENT_AMBULATORY_CARE_PROVIDER_SITE_OTHER): Payer: Self-pay | Admitting: Family Medicine

## 2020-03-01 ENCOUNTER — Telehealth (HOSPITAL_COMMUNITY): Payer: Self-pay | Admitting: *Deleted

## 2020-03-01 ENCOUNTER — Encounter (HOSPITAL_COMMUNITY): Payer: Self-pay | Admitting: *Deleted

## 2020-03-01 ENCOUNTER — Other Ambulatory Visit: Payer: Self-pay

## 2020-03-01 ENCOUNTER — Telehealth (INDEPENDENT_AMBULATORY_CARE_PROVIDER_SITE_OTHER): Payer: 59 | Admitting: Family Medicine

## 2020-03-01 VITALS — BP 119/79 | HR 63 | Temp 97.8°F | Ht 64.0 in | Wt 275.0 lb

## 2020-03-01 DIAGNOSIS — E559 Vitamin D deficiency, unspecified: Secondary | ICD-10-CM

## 2020-03-01 DIAGNOSIS — Z6841 Body Mass Index (BMI) 40.0 and over, adult: Secondary | ICD-10-CM

## 2020-03-01 DIAGNOSIS — R7303 Prediabetes: Secondary | ICD-10-CM

## 2020-03-01 MED ORDER — VITAMIN D (ERGOCALCIFEROL) 1.25 MG (50000 UNIT) PO CAPS
50000.0000 [IU] | ORAL_CAPSULE | ORAL | 0 refills | Status: DC
Start: 2020-03-01 — End: 2020-03-29

## 2020-03-01 NOTE — Telephone Encounter (Signed)
Left message on voicemail in reference to upcoming appointment scheduled for 03/03/20. Phone number given for a call back so details instructions can be given.  My Chart letter sent with instructions.  Ricky Ala

## 2020-03-01 NOTE — Telephone Encounter (Signed)
Patient returning call to go over pre-procedure instructions.

## 2020-03-02 NOTE — Progress Notes (Signed)
TeleHealth Visit:  Due to the COVID-19 pandemic, this visit was completed with telemedicine (audio/video) technology to reduce patient and provider exposure as well as to preserve personal protective equipment.   [name] has verbally consented to this TeleHealth visit. The patient is located at home, the provider is located at the [facility] office. The participants in this visit include the listed provider and patient. The visit was conducted today via video.   Chief Complaint: OBESITY Gina Morris is here to discuss her progress with her obesity treatment plan along with follow-up of her obesity related diagnoses. Gina Morris is on the Category 2 Plan and states she is following her eating plan approximately 50% of the time. Gina Morris states she is walking more.  Today's visit was #: 2 Starting weight: 275 lbs Starting date: 02/16/2020  Interim History: After her last appointment, [name] was seen at A-Fib clinic and started on Eliquis. She reports no further palpitations or symptoms. For the first week, pt did not follow plan. She followed the plan the second week and felt it became monotonous. She isn't doing cheese on sandwich due to her distaste for it. For snack calories, she was doing yogurt, pickle, celery, fruits. She wanted to do crackers or carbs for snacks. She denies hunger. Previously, pt would wait too long to eat at work and then be ravenous.  Subjective:   1. Vitamin D deficiency Pt denies nausea, vomiting, and muscle weakness but notes fatigue. Pt is on Vit D 5,000 IU daily.  2. Pre-diabetes [name] has an A1c of 5.6 with an insulin level of 15.5. She is not on medication. She reports carb cravings.  Assessment/Plan:   1. Vitamin D deficiency Low Vitamin D level contributes to fatigue and are associated with obesity, breast, and colon cancer. She agrees to start to take prescription Vitamin D @50 ,000 IU every week and will follow-up for routine testing of  Vitamin D, at least 2-3 times per year to avoid over-replacement. Repeat lab in 3 months.  - Vitamin D, Ergocalciferol, (DRISDOL) 1.25 MG (50000 UNIT) CAPS capsule; Take 1 capsule (50,000 Units total) by mouth every 7 (seven) days.  Dispense: 4 capsule; Refill: 0  2. Pre-diabetes [name] will continue to work on weight loss, exercise, and decreasing simple carbohydrates to help decrease the risk of diabetes. Repeat lab in 3 months. No medication at this time. If carb cravings increase or hunger increases, we will consider medication.  3. Class 3 severe obesity due to excess calories with body mass index (BMI) of 50.0 to 59.9 in adult, unspecified whether serious comorbidity present [facility]) Eisha is currently in the action stage of change. As such, her goal is to continue with weight loss efforts. She has agreed to the Category 2 Plan.   Exercise goals: No exercise has been prescribed at this time.  Behavioral modification strategies: increasing lean protein intake, meal planning and cooking strategies, keeping healthy foods in the home and planning for success.  [name] has agreed to follow-up with our clinic in 2 weeks. She was informed of the importance of frequent follow-up visits to maximize her success with intensive lifestyle modifications for her multiple health conditions.  Objective:   VITALS: Per patient if applicable, see vitals. GENERAL: Alert and in no acute distress. CARDIOPULMONARY: No increased WOB. Speaking in clear sentences.  PSYCH: Pleasant and cooperative. Speech normal rate and rhythm. Affect is appropriate. Insight and judgement are appropriate. Attention is focused, linear, and appropriate.  NEURO: Oriented as arrived to  appointment on time with no prompting.   Lab Results  Component Value Date   CREATININE 0.77 02/16/2020   BUN 16 02/16/2020   NA 142 02/16/2020   K 4.4 02/16/2020   CL 103 02/16/2020   CO2 22 02/16/2020   Lab Results  Component Value Date    ALT 19 02/16/2020   AST 19 02/16/2020   ALKPHOS 120 02/16/2020   BILITOT 0.5 02/16/2020   Lab Results  Component Value Date   HGBA1C 5.6 02/16/2020   HGBA1C 5.9 11/11/2019   HGBA1C 5.2 12/23/2017   Lab Results  Component Value Date   INSULIN 15.5 02/16/2020   Lab Results  Component Value Date   TSH 2.170 02/16/2020   Lab Results  Component Value Date   CHOL 163 02/16/2020   HDL 53 02/16/2020   LDLCALC 95 02/16/2020   TRIG 79 02/16/2020   CHOLHDL 3 11/11/2019   Lab Results  Component Value Date   WBC 7.2 02/16/2020   HGB 14.9 02/16/2020   HCT 44.6 02/16/2020   MCV 93 02/16/2020   PLT 325 02/16/2020   No results found for: IRON, TIBC, FERRITIN  Attestation Statements:   Reviewed by clinician on day of visit: allergies, medications, problem list, medical history, surgical history, family history, social history, and previous encounter notes.  Edmund Hilda, am acting as transcriptionist for Reuben Likes, MD.  I have reviewed the above documentation for accuracy and completeness, and I agree with the above. - Katherina Mires, MD

## 2020-03-03 ENCOUNTER — Ambulatory Visit (HOSPITAL_COMMUNITY): Payer: 59 | Attending: Internal Medicine

## 2020-03-03 ENCOUNTER — Other Ambulatory Visit: Payer: Self-pay

## 2020-03-03 VITALS — Ht 64.0 in | Wt 280.0 lb

## 2020-03-03 DIAGNOSIS — I259 Chronic ischemic heart disease, unspecified: Secondary | ICD-10-CM | POA: Insufficient documentation

## 2020-03-03 DIAGNOSIS — I2510 Atherosclerotic heart disease of native coronary artery without angina pectoris: Secondary | ICD-10-CM | POA: Insufficient documentation

## 2020-03-03 DIAGNOSIS — I2583 Coronary atherosclerosis due to lipid rich plaque: Secondary | ICD-10-CM | POA: Diagnosis present

## 2020-03-03 LAB — MYOCARDIAL PERFUSION IMAGING
LV dias vol: 58 mL (ref 46–106)
LV sys vol: 22 mL
Peak HR: 70 {beats}/min
Rest HR: 66 {beats}/min
SDS: 0
SRS: 0
SSS: 0
TID: 1.16

## 2020-03-03 MED ORDER — REGADENOSON 0.4 MG/5ML IV SOLN
0.4000 mg | Freq: Once | INTRAVENOUS | Status: AC
  Administered 2020-03-03: 0.4 mg via INTRAVENOUS

## 2020-03-03 MED ORDER — TECHNETIUM TC 99M TETROFOSMIN IV KIT
32.6000 | PACK | Freq: Once | INTRAVENOUS | Status: AC | PRN
  Administered 2020-03-03: 32.6 via INTRAVENOUS
  Filled 2020-03-03: qty 33

## 2020-03-03 MED ORDER — TECHNETIUM TC 99M TETROFOSMIN IV KIT
10.9000 | PACK | Freq: Once | INTRAVENOUS | Status: AC | PRN
  Administered 2020-03-03: 10.9 via INTRAVENOUS
  Filled 2020-03-03: qty 11

## 2020-03-12 ENCOUNTER — Encounter (INDEPENDENT_AMBULATORY_CARE_PROVIDER_SITE_OTHER): Payer: Self-pay | Admitting: Family Medicine

## 2020-03-15 ENCOUNTER — Ambulatory Visit (INDEPENDENT_AMBULATORY_CARE_PROVIDER_SITE_OTHER): Payer: 59 | Admitting: Family Medicine

## 2020-03-15 ENCOUNTER — Ambulatory Visit (INDEPENDENT_AMBULATORY_CARE_PROVIDER_SITE_OTHER): Payer: 59 | Admitting: Physician Assistant

## 2020-03-23 NOTE — Progress Notes (Signed)
CARDIOLOGY OFFICE NOTE  Date:  04/02/2020    Gina Morris Date of Birth: 10/05/1955 Medical Record #[id number]  PCP:  Anne Ng, NP  Cardiologist:  Eden Emms  No chief complaint on file.   History of Present Illness: Gina Morris is a 65 y.o. female has a history of CAD with NSTEMI 12/2017 - cath with single vessel DES to mid AV circumflex - normal EF, prior smoker, GERD, and HLD - on statin.   02/16/20 found to be in afib ate weight loss clinic Seen by Fenton in afib clinic 02/16/20. CHADVASC 3 _> 4 in April turns 65.  Noted palpitations, fatigue and dizziness Was back in NSR during his visit Started on eliquis and continued on lopressor d/c ASA Referred for sleep study   Myovue done 03/03/20 normal no ischemia EF 62% TSH normal   Discussed getting back on 81 mg ASA   Past Medical History:  Diagnosis Date  . Back pain   . Back pain   . Baker's cyst of knee, left   . Bilateral swelling of feet and ankles   . Coronary artery disease   . Elevated blood pressure reading 12/23/2017  . 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 Fatty liver   . GERD (gastroesophageal reflux disease)   . Joint pain   . Morbid obesity (HCC)   . Myocardial infarction (HCC)   . Smoking greater than 10 pack years 06/18/2012  . Tobacco abuse     Past Surgical History:  Procedure Laterality Date  . CORONARY STENT INTERVENTION N/A 12/24/2017   Procedure: CORONARY STENT INTERVENTION;  Surgeon: Runell Gess, MD;  [facility];  Service: Cardiovascular;  Laterality: N/A;  . CORONARY STENT PLACEMENT  12/24/2017  . LEFT HEART CATH AND CORONARY ANGIOGRAPHY N/A 12/24/2017   Procedure: LEFT HEART CATH AND CORONARY ANGIOGRAPHY;  Surgeon: Runell Gess, MD;  [facility];  Service: Cardiovascular;  Laterality: N/A;  . TONSILLECTOMY       Medications: Current Meds  Medication Sig  . apixaban (ELIQUIS) 5 MG TABS tablet Take 1 tablet (5 mg total) by mouth 2 (two)  times daily.  Marland Kitchen atorvastatin (LIPITOR) 80 MG tablet Take 1 tablet (80 mg total) by mouth daily at 6 PM.  . metoprolol tartrate (LOPRESSOR) 25 MG tablet Take 1 tablet (25 mg total) by mouth 2 (two) times daily.  . nitroGLYCERIN (NITROSTAT) 0.4 MG SL tablet Place 1 tablet (0.4 mg total) under the tongue every 5 (five) minutes as needed for chest pain.  . Probiotic Product (PROBIOTIC PO) Take 1 capsule by mouth in the morning and at bedtime.  . Vitamin D, Ergocalciferol, (DRISDOL) 1.25 MG (50000 UNIT) CAPS capsule Take 1 capsule (50,000 Units total) by mouth every 7 (seven) days.     Allergies: Allergies  Allergen Reactions  . Penicillins Other (See Comments)    Unknown reaction  DID THE REACTION INVOLVE: Swelling of the face/tongue/throat, SOB, or low BP? Unknown Sudden or severe rash/hives, skin peeling, or the inside of the mouth or nose? Unknown Did it require medical treatment? Unknown When did it last happen? If all above answers are "NO", may proceed with cephalosporin use.     Social History: The patient  reports that she has quit smoking. Her smoking use included cigarettes. She smoked 0.50 packs per day. She has never used smokeless tobacco. She reports current alcohol use of about 2.0 - 3.0 standard drinks of alcohol per week. She reports  that she does not use drugs.   Family History: The patient's family history includes Alcohol abuse in her mother; Anxiety disorder in her mother; Cancer in her father; Depression in her mother; Diverticulitis in her mother and sister; Healthy in her sister; Kidney disease in her mother; Pancreatic cancer in her father; Thyroid disease in her mother; Uterine cancer in her maternal grandmother.   Review of Systems: Please see the history of present illness.   All other systems are reviewed and negative.   Physical Exam: VS:  BP 116/70   Pulse 65   Ht 5\' 4"  (1.626 m)   Wt 126.6 kg   SpO2 98%   BMI 47.89 kg/m  .  BMI Body mass index  is 47.89 kg/m.  Wt Readings from Last 3 Encounters:  04/02/20 126.6 kg  03/29/20 126.1 kg  03/03/20 127 kg   Affect appropriate Obese  HEENT: normal Neck supple with no adenopathy JVP normal no bruits no thyromegaly Lungs clear with no wheezing and good diaphragmatic motion Heart:  S1/S2 no murmur, no rub, gallop or click PMI normal Abdomen: benighn, BS positve, no tenderness, no AAA no bruit.  No HSM or HJR Distal pulses intact with no bruits No edema Neuro non-focal Skin warm and dry No muscular weakness    LABORATORY DATA:  EKG:  SR LAE nonspecific ST changes 02/16/20   Lab Results  Component Value Date   WBC 7.2 02/16/2020   HGB 14.9 02/16/2020   HCT 44.6 02/16/2020   PLT 325 02/16/2020   GLUCOSE 92 02/16/2020   CHOL 163 02/16/2020   TRIG 79 02/16/2020   HDL 53 02/16/2020   LDLCALC 95 02/16/2020   ALT 19 02/16/2020   AST 19 02/16/2020   NA 142 02/16/2020   K 4.4 02/16/2020   CL 103 02/16/2020   CREATININE 0.77 02/16/2020   BUN 16 02/16/2020   CO2 22 02/16/2020   TSH 2.170 02/16/2020   INR 0.99 12/24/2017   HGBA1C 5.6 02/16/2020     BNP (last 3 results) No results for input(s): BNP in the last 8760 hours.  ProBNP (last 3 results) No results for input(s): PROBNP in the last 8760 hours.   Other Studies Reviewed Today:  CORONARY STENT INTERVENTION 12/2017  LEFT HEART CATH AND CORONARY ANGIOGRAPHY   Conclusion    Prox Cx to Mid Cx lesion is 90% stenosed.  Ost LAD to Prox LAD lesion is 30% stenosed.  Prox LAD to Mid LAD lesion is 30% stenosed.  A drug-eluting stent was successfully placed.  Post intervention, there is a 0% residual stenosis.    Echo Study Conclusions 12/2017  - Left ventricle: The cavity size was normal. Wall thickness was  increased in a pattern of mild LVH. Systolic function was  vigorous. The estimated ejection fraction was in the range of 65%  to 70%. Wall motion was normal; there were no regional wall   motion abnormalities. Doppler parameters are consistent with  abnormal left ventricular relaxation (grade 1 diastolic  dysfunction). The E/e&' ratio is <8, suggesting normal LV filling  pressure.  - Mitral valve: Mildly thickened leaflets . There was mild  regurgitation.  - Left atrium: The atrium was mildly dilated.  - Right atrium: The atrium was mildly dilated.  - Tricuspid valve: There was mild regurgitation.  - Pulmonary arteries: PA peak pressure: 27 mm Hg (S).  - Inferior vena cava: The vessel was normal in size. The  respirophasic diameter changes were in the normal range (>=  50%),  consistent with normal central venous pressure.   Impressions:   - LVEF 65-70%, mild LVH, normal wall motion, grade 1 DD, normal LV  filling pressure, mild MR, mild biatrial enlargement, mild TR,  RVSP 27 mmHg, normal IVC.    ASSESSMENT AND PLAN:   1. New onset AF - back in NSR - sleep study ordered continue eliquis and lopressor   2. CAD - prior MI with DES to [facility] for single vessel disease - now off aspirin and has started Eliquis per the AF clinic -Myovue normal 03/03/20 [id number] 3. HTN - Well controlled.  Continue current medications and low sodium Dash type diet.    4. HLD - LDL 95 continue high dose lipitor   5. Obesity - now in the Weight Loss clinic - she seems motivated to make changes.   6. Concern for OSA - sleep study ordered by AF clinic  7. Prior smoker - not smoking. CXR 02/06/19 NAD   Current medicines are reviewed with the patient today.  The patient does not have concerns regarding medicines other than what has been noted above.  The following changes have been made:  See above.  Labs/ tests ordered today include:    No orders of the defined types were placed in this encounter.    Disposition:   FU 6 months    Patient is agreeable to this plan and will call if any problems develop in the interim.   Signed: Charlton Haws, MD  04/02/2020 8:28  AM  [facility] [address] [place], [place]  [address] Phone: [phone] Fax: [phone]

## 2020-03-24 ENCOUNTER — Telehealth: Payer: Self-pay | Admitting: *Deleted

## 2020-03-24 NOTE — Telephone Encounter (Signed)
Staff message sent to [facility] ok to schedule sleep study. Per BrysonT @ 10:20AM no PA is required.

## 2020-03-24 NOTE — Telephone Encounter (Signed)
Patient is scheduled for lab study on 05-21-20 Patient understands his sleep study will be done at [facility] sleep lab. Patient understands he will receive a sleep packet in a week or so. Patient understands to call if he does not receive the sleep packet in a timely manner. Patient agrees with treatment and thanked me for call.

## 2020-03-24 NOTE — Telephone Encounter (Signed)
-----   Message from Gaynelle Cage, [place] sent at 03/24/2020 10:20 AM EDT ----- [facility], ok to schedule sleep study. Per patient's insurance no PA is required. ----- Message ----- From: Ethelda Chick, RN Sent: 03/23/2020   2:29 PM EDT To: [facility]  Do you know where we are on patient getting sleep study?  Thank you, Pam ----- Message ----- From: Wendall Stade, MD Sent: 03/23/2020   2:23 PM EDT To: Ethelda Chick, RN  She was supposed to have sleep study ?

## 2020-03-29 ENCOUNTER — Encounter (INDEPENDENT_AMBULATORY_CARE_PROVIDER_SITE_OTHER): Payer: Self-pay | Admitting: Family Medicine

## 2020-03-29 ENCOUNTER — Other Ambulatory Visit: Payer: Self-pay

## 2020-03-29 ENCOUNTER — Ambulatory Visit (INDEPENDENT_AMBULATORY_CARE_PROVIDER_SITE_OTHER): Payer: 59 | Admitting: Family Medicine

## 2020-03-29 VITALS — BP 126/77 | HR 70 | Temp 97.7°F | Ht 64.0 in | Wt 278.0 lb

## 2020-03-29 DIAGNOSIS — E559 Vitamin D deficiency, unspecified: Secondary | ICD-10-CM

## 2020-03-29 DIAGNOSIS — R7303 Prediabetes: Secondary | ICD-10-CM | POA: Diagnosis not present

## 2020-03-29 DIAGNOSIS — Z6841 Body Mass Index (BMI) 40.0 and over, adult: Secondary | ICD-10-CM

## 2020-03-29 DIAGNOSIS — Z9189 Other specified personal risk factors, not elsewhere classified: Secondary | ICD-10-CM

## 2020-03-29 MED ORDER — VITAMIN D (ERGOCALCIFEROL) 1.25 MG (50000 UNIT) PO CAPS: 50000.0000 [IU] | ORAL_CAPSULE | ORAL | 0 refills | Status: DC

## 2020-03-31 NOTE — Progress Notes (Signed)
Chief Complaint:   OBESITY Gina Morris is here to discuss her progress with her obesity treatment plan along with follow-up of her obesity related diagnoses. Gina Morris is on the Category 2 Plan and states she is following her eating plan approximately 50% of the time. Gina Morris states she is walking 1/4 mile 7 times per week.  Today's visit was #: 3 Starting weight: 275 lbs Starting date: 02/16/2020 Today's weight: 278 lbs Today's date: 03/29/2020 Total lbs lost to date: 0 Total lbs lost since last in-office visit: 0  Interim History: The last few weeks, Gina Morris has struggled to adhere to the plan. She feels resistance to following ht plan but isn't sure why. She has significantly increased her water intake over the last week. Over the last week, she has been very compliant to plan. One of her challenges is bringing lunch daily. She is doing [employer] egg McMuffin and coffee for breakfast, and fish and vegetables or fish and salad for dinner.  Subjective:   1. Vitamin D deficiency Gina Morris had a Vit D level of 32.9 on 02/16/2020. She denies nausea, vomiting, and muscle weakness but notes fatigue. Pt is on prescription Vit D.  2. Pre-diabetes Gina Morris has an A1c of 5.6 and insulin level of 15.5. She is not on medication.  Lab Results  Component Value Date   HGBA1C 5.6 02/16/2020   Lab Results  Component Value Date   INSULIN 15.5 02/16/2020    3. At risk for osteoporosis Gina Morris is at higher risk of osteopenia and osteoporosis due to Vitamin D deficiency.   Assessment/Plan:   1. Vitamin D deficiency Low Vitamin D level contributes to fatigue and are associated with obesity, breast, and colon cancer. She agrees to continue to take prescription Vitamin D @50 ,000 IU every week and will follow-up for routine testing of Vitamin D, at least 2-3 times per year to avoid over-replacement.  - Vitamin D, Ergocalciferol, (DRISDOL) 1.25 MG (50000 UNIT) CAPS capsule; Take 1 capsule (50,000 Units  total) by mouth every 7 (seven) days.  Dispense: 4 capsule; Refill: 0  2. Pre-diabetes Gina Morris will continue to work on weight loss, exercise, and decreasing simple carbohydrates to help decrease the risk of diabetes. Repeat labs in 3 months.  3. At risk for osteoporosis Gina Morris was given approximately 15 minutes of osteoporosis prevention counseling today. Gina Morris is at risk for osteopenia and osteoporosis due to her Vitamin D deficiency. She was encouraged to take her Vitamin D and follow her higher calcium diet and increase strengthening exercise to help strengthen her bones and decrease her risk of osteopenia and osteoporosis.  Repetitive spaced learning was employed today to elicit superior memory formation and behavioral change.  4. Class 3 severe obesity due to excess calories with serious comorbidity and body mass index (BMI) of 45.0 to 49.9 in adult Gina Morris) Gina Morris is currently in the action stage of change. As such, her goal is to continue with weight loss efforts. She has agreed to the Category 2 Plan.   Exercise goals: No exercise has been prescribed at this time.  Behavioral modification strategies: increasing lean protein intake, meal planning and cooking strategies, keeping healthy foods in the home and planning for success.  Gina Morris has agreed to follow-up with our clinic in 2 weeks. She was informed of the importance of frequent follow-up visits to maximize her success with intensive lifestyle modifications for her multiple health conditions.   Objective:   Blood pressure 126/77, pulse 70, temperature 97.7 F (36.5 C), temperature  source Oral, height 5\' 4"  (1.626 m), weight 278 lb (126.1 kg), SpO2 97 %. Body mass index is 47.72 kg/m.  General: Cooperative, alert, well developed, in no acute distress. HEENT: Conjunctivae and lids unremarkable. Cardiovascular: Regular rhythm.  Lungs: Normal work of breathing. Neurologic: No focal deficits.   Lab Results  Component Value  Date   CREATININE 0.77 02/16/2020   BUN 16 02/16/2020   NA 142 02/16/2020   K 4.4 02/16/2020   CL 103 02/16/2020   CO2 22 02/16/2020   Lab Results  Component Value Date   ALT 19 02/16/2020   AST 19 02/16/2020   ALKPHOS 120 02/16/2020   BILITOT 0.5 02/16/2020   Lab Results  Component Value Date   HGBA1C 5.6 02/16/2020   HGBA1C 5.9 11/11/2019   HGBA1C 5.2 12/23/2017   Lab Results  Component Value Date   INSULIN 15.5 02/16/2020   Lab Results  Component Value Date   TSH 2.170 02/16/2020   Lab Results  Component Value Date   CHOL 163 02/16/2020   HDL 53 02/16/2020   LDLCALC 95 02/16/2020   TRIG 79 02/16/2020   CHOLHDL 3 11/11/2019   Lab Results  Component Value Date   WBC 7.2 02/16/2020   HGB 14.9 02/16/2020   HCT 44.6 02/16/2020   MCV 93 02/16/2020   PLT 325 02/16/2020    Attestation Statements:   Reviewed by clinician on day of visit: allergies, medications, problem list, medical history, surgical history, family history, social history, and previous encounter notes.  02/18/2020, am acting as transcriptionist for Edmund Hilda, MD.   I have reviewed the above documentation for accuracy and completeness, and I agree with the above. - Reuben Likes, MD

## 2020-04-02 ENCOUNTER — Encounter: Payer: Self-pay | Admitting: Cardiovascular Disease

## 2020-04-02 ENCOUNTER — Ambulatory Visit (INDEPENDENT_AMBULATORY_CARE_PROVIDER_SITE_OTHER): Payer: 59 | Admitting: Cardiovascular Disease

## 2020-04-02 ENCOUNTER — Other Ambulatory Visit: Payer: Self-pay

## 2020-04-02 VITALS — BP 116/70 | HR 65 | Ht 64.0 in | Wt 279.0 lb

## 2020-04-02 DIAGNOSIS — I480 Paroxysmal atrial fibrillation: Secondary | ICD-10-CM | POA: Diagnosis not present

## 2020-04-02 DIAGNOSIS — I2510 Atherosclerotic heart disease of native coronary artery without angina pectoris: Secondary | ICD-10-CM | POA: Diagnosis not present

## 2020-04-02 NOTE — Patient Instructions (Signed)

## 2020-04-12 ENCOUNTER — Ambulatory Visit (INDEPENDENT_AMBULATORY_CARE_PROVIDER_SITE_OTHER): Payer: 59 | Admitting: Family Medicine

## 2020-04-15 NOTE — Telephone Encounter (Signed)
Patient called to cancel her sleep study because she says she declines to wear a cpap.

## 2020-05-11 ENCOUNTER — Other Ambulatory Visit (INDEPENDENT_AMBULATORY_CARE_PROVIDER_SITE_OTHER): Payer: Self-pay | Admitting: Family Medicine

## 2020-05-11 DIAGNOSIS — E559 Vitamin D deficiency, unspecified: Secondary | ICD-10-CM

## 2020-05-21 ENCOUNTER — Encounter (HOSPITAL_BASED_OUTPATIENT_CLINIC_OR_DEPARTMENT_OTHER): Payer: 59 | Admitting: Cardiology

## 2020-06-25 ENCOUNTER — Telehealth: Payer: Self-pay | Admitting: Cardiovascular Disease

## 2020-06-25 NOTE — Telephone Encounter (Signed)
*  STAT* If patient is at the pharmacy, call can be transferred to refill team.   1. Which medications need to be refilled? (please list name of each medication and dose if known) atorvastatin (LIPITOR) 80 MG tablet, metoprolol tartrate (LOPRESSOR) 25 MG tablet  2. Which pharmacy/location (including street and city if local pharmacy) is medication to be sent to? Mail order to [facility]  3. Do they need a 30 day or 90 day supply? 90

## 2020-06-25 NOTE — Telephone Encounter (Signed)
Called pt to inquire where pt needed medications sent to. Pt stated that she was driving and would have to call back to give the information needed.

## 2020-07-12 ENCOUNTER — Telehealth: Payer: Self-pay | Admitting: Cardiovascular Disease

## 2020-07-12 MED ORDER — ATORVASTATIN CALCIUM 80 MG PO TABS: 80.0000 mg | ORAL_TABLET | Freq: Every day | ORAL | 2 refills | Status: DC

## 2020-07-12 MED ORDER — METOPROLOL TARTRATE 25 MG PO TABS: 25.0000 mg | ORAL_TABLET | Freq: Two times a day (BID) | ORAL | 2 refills | Status: DC

## 2020-07-12 NOTE — Telephone Encounter (Signed)
Pt's medication was sent to pt's pharmacy as requested. Confirmation received.  °

## 2020-07-12 NOTE — Telephone Encounter (Signed)
*  STAT* If patient is at the pharmacy, call can be transferred to refill team.   1. Which medications need to be refilled? (please list name of each medication and dose if known)  metoprolol tartrate (LOPRESSOR) 25 MG tablet atorvastatin (LIPITOR) 80 MG tablet Vitamin D, Ergocalciferol, (DRISDOL) 1.25 MG (50000 UNIT) CAPS capsule  2. Which pharmacy/location (including street and city if local pharmacy) is medication to be sent to? [facility] [place] [facility] [place] [address]  3. Do they need a 30 day or 90 day supply?  90 day supply

## 2020-07-13 NOTE — Progress Notes (Signed)
Virtual Visit via Video Note   This visit type was conducted due to national recommendations for restrictions regarding the COVID-19 Pandemic (e.g. social distancing) in an effort to limit this patient's exposure and mitigate transmission in our community.  Due to her co-morbid illnesses, this patient is at least at moderate risk for complications without adequate follow up.  This format is felt to be most appropriate for this patient at this time.  All issues noted in this document were discussed and addressed.  A limited physical exam was performed with this format.  Please refer to the patient's chart for her consent to telehealth for [facility].   Patient Location: Home  Physician Location : Office  CARDIOLOGY OFFICE NOTE  Date:  07/13/2020    Gina Morris Date of Birth: 01/21/1955 Medical Record #[id number]  PCP:  Anne Ng, NP  Cardiologist:  Eden Emms  No chief complaint on file.   History of Present Illness: Gina Morris is a 65 y.o. female has a history of CAD with NSTEMI 12/2017 - cath with single vessel DES to mid AV circumflex - normal EF, prior smoker, GERD, and HLD - on statin.   02/16/20 found to be in afib ate weight loss clinic Seen by Fenton in afib clinic 02/16/20. 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   Fatty liver    GERD (gastroesophageal reflux disease)    Joint pain    Morbid obesity (HCC)    Myocardial infarction (HCC)    Smoking  greater than 10 pack years 06/18/2012   Tobacco abuse     Past Surgical History:  Procedure Laterality Date   CORONARY STENT INTERVENTION N/A 12/24/2017   Procedure: CORONARY STENT INTERVENTION;  Surgeon: Runell Gess, MD;  [facility];  Service: Cardiovascular;  Laterality: N/A;   CORONARY STENT PLACEMENT  12/24/2017   LEFT HEART CATH AND CORONARY ANGIOGRAPHY N/A 12/24/2017   Procedure: LEFT HEART CATH AND CORONARY ANGIOGRAPHY;  Surgeon: Runell Gess, MD;  [facility]: [facility];  Service: Cardiovascular;  Laterality: N/A;   TONSILLECTOMY       Medications: No outpatient medications have been marked as taking for the 07/21/20 encounter (Appointment) with Wendall Stade, MD.     Allergies: Allergies  Allergen Reactions   Penicillins Other (See Comments)    Unknown reaction  DID THE REACTION INVOLVE: Swelling of the face/tongue/throat, SOB, or low BP? Unknown Sudden or severe rash/hives, skin peeling, or the inside of the mouth or nose? Unknown Did it require medical treatment? Unknown When did it last happen?       If all above answers are "NO", may proceed with cephalosporin use.     Social History: The patient  reports that she has quit smoking. Her smoking use included cigarettes. She smoked an average of 0.50 packs per day. She has never used smokeless tobacco.  She reports current alcohol use of about 2.0 - 3.0 standard drinks of alcohol per week. She reports that she does not use drugs.   Family History: The patient's family history includes Alcohol abuse in her mother; Anxiety disorder in her mother; Cancer in her father; Depression in her mother; Diverticulitis in her mother and sister; Healthy in her sister; Kidney disease in her mother; Pancreatic cancer in her father; Thyroid disease in her mother; Uterine cancer in her maternal grandmother.   Review of Systems: Please see the history of present illness.   All other systems are  reviewed and negative.   Physical Exam: VS:  There were no vitals taken for this visit. Marland Kitchen  BMI There is no height or weight on file to calculate BMI.  Wt Readings from Last 3 Encounters:  04/02/20 126.6 kg  03/29/20 126.1 kg  03/03/20 127 kg   Affect appropriate Obese  HEENT: normal Neck supple with no adenopathy JVP normal no bruits no thyromegaly Lungs clear with no wheezing and good diaphragmatic motion Heart:  S1/S2 no murmur, no rub, gallop or click PMI normal Abdomen: benighn, BS positve, no tenderness, no AAA no bruit.  No HSM or HJR Distal pulses intact with no bruits No edema Neuro non-focal Skin warm and dry No muscular weakness    LABORATORY DATA:  EKG:  SR LAE nonspecific ST changes 02/16/20   Lab Results  Component Value Date   WBC 7.2 02/16/2020   HGB 14.9 02/16/2020   HCT 44.6 02/16/2020   PLT 325 02/16/2020   GLUCOSE 92 02/16/2020   CHOL 163 02/16/2020   TRIG 79 02/16/2020   HDL 53 02/16/2020   LDLCALC 95 02/16/2020   ALT 19 02/16/2020   AST 19 02/16/2020   NA 142 02/16/2020   K 4.4 02/16/2020   CL 103 02/16/2020   CREATININE 0.77 02/16/2020   BUN 16 02/16/2020   CO2 22 02/16/2020   TSH 2.170 02/16/2020   INR 0.99 12/24/2017   HGBA1C 5.6 02/16/2020     BNP (last 3 results) No results for input(s): BNP in the last 8760 hours.  ProBNP (last 3 results) No results for input(s): PROBNP in the last 8760 hours.   Other Studies Reviewed Today:  CORONARY STENT INTERVENTION 12/2017  LEFT HEART CATH AND CORONARY ANGIOGRAPHY   Conclusion    Prox Cx to Mid Cx lesion is 90% stenosed. Ost LAD to Prox LAD lesion is 30% stenosed. Prox LAD to Mid LAD lesion is 30% stenosed. A drug-eluting stent was successfully placed. Post intervention, there is a 0% residual stenosis.    Echo Study Conclusions 12/2017  - Left ventricle: The cavity size was normal. Wall thickness was    increased in a pattern of mild LVH. Systolic function was     vigorous. The estimated ejection fraction was in the range of 65%    to 70%. Wall motion was normal; there were no regional wall    motion abnormalities. Doppler parameters are consistent with    abnormal left ventricular relaxation (grade 1 diastolic    dysfunction). The E/e&' ratio is <8, suggesting normal LV filling    pressure.  - Mitral valve: Mildly thickened leaflets . There was mild    regurgitation.  - Left atrium: The atrium was mildly dilated.  - Right atrium: The atrium was mildly dilated.  - Tricuspid valve: There was mild regurgitation.  - Pulmonary arteries: PA peak pressure: 27 mm Hg (S).  - Inferior vena cava: The vessel  was normal in size. The    respirophasic diameter changes were in the normal range (>= 50%),    consistent with normal central venous pressure.   Impressions:   - LVEF 65-70%, mild LVH, normal wall motion, grade 1 DD, normal LV    filling pressure, mild MR, mild biatrial enlargement, mild TR,    RVSP 27 mmHg, normal IVC.    ASSESSMENT AND PLAN:   1. New onset AF - back in NSR - sleep study ordered continue eliquis and lopressor   2. CAD - prior MI with DES to [facility] for single vessel disease - 81 mg ASA and  Eliquis per the AF clinic -Myovue normal 03/03/20 [id number] 3. HTN - Well controlled.  Continue current medications and low sodium Dash type diet.    4. HLD - LDL 95 continue high dose lipitor   5. Obesity - now in the Weight Loss clinic - she seems motivated to make changes.   6. Concern for OSA - sleep study ordered by AF clinic 02/16/20    7. Prior smoker - not smoking. CXR 02/06/19 NAD   Time: spent reviewing chart myovue, cath, echo direct patient interview and composing note 20 minutes   Current medicines are reviewed with the patient today.  The patient does not have concerns regarding medicines other than what has been noted above.  The following changes have been made:  See above.  Labs/ tests ordered today include:    No orders  of the defined types were placed in this encounter.    Disposition:   FU 6 months    Patient is agreeable to this plan and will call if any problems develop in the interim.   Signed: Charlton Haws, MD  07/13/2020 1:17 PM  [facility] [address] [place], [place]  [address] Phone: [phone] Fax: [phone]

## 2020-07-16 ENCOUNTER — Ambulatory Visit: Payer: 59 | Admitting: Cardiovascular Disease

## 2020-07-21 ENCOUNTER — Other Ambulatory Visit: Payer: Self-pay

## 2020-07-21 ENCOUNTER — Telehealth (INDEPENDENT_AMBULATORY_CARE_PROVIDER_SITE_OTHER): Payer: 59 | Admitting: Cardiovascular Disease

## 2020-07-21 DIAGNOSIS — E782 Mixed hyperlipidemia: Secondary | ICD-10-CM

## 2020-07-21 DIAGNOSIS — I10 Essential (primary) hypertension: Secondary | ICD-10-CM

## 2020-07-21 DIAGNOSIS — I480 Paroxysmal atrial fibrillation: Secondary | ICD-10-CM

## 2020-07-21 DIAGNOSIS — I2510 Atherosclerotic heart disease of native coronary artery without angina pectoris: Secondary | ICD-10-CM

## 2020-07-26 NOTE — Progress Notes (Signed)
CARDIOLOGY OFFICE NOTE  Date:  07/28/2020    Gina Morris Date of Birth: [birth year] Medical Record #[id number]  PCP:  Flossie Buffy, NP  Cardiologist:  Johnsie Cancel  No chief complaint on file.   History of Present Illness: Gina Morris is a 65 y.o. female has a history of CAD with NSTEMI 12/2017 - cath with single vessel DES to mid AV circumflex - normal EF, prior smoker, GERD, and HLD - on statin.   02/16/20 found to be in afib ate weight loss clinic Seen by [place] in [place] clinic 02/16/20. CHADVASC 4 Noted palpitations, fatigue and dizziness Was back in NSR during his visit Started on eliquis and continued on lopressor d/c ASA Referred for sleep study   Myovue done 03/03/20 normal no ischemia EF 62% TSH normal   Discussed getting back on 81 mg ASA  Discussed ok to use Plenity for weight loss  She wants some testing for RA- has pain/swelling in ankles and hands   Prep note patient did not answer phone   Past Medical History:  Diagnosis Date   Back pain    Back pain    Baker's cyst of knee, left    Bilateral swelling of feet and ankles    Coronary artery disease    Elevated blood pressure reading 12/23/2017   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   Fatty liver    GERD (gastroesophageal reflux disease)    Joint pain    Morbid obesity ([place])    Myocardial infarction ([place])    Smoking greater than 10 pack years 06/18/2012   Tobacco abuse     Past Surgical History:  Procedure Laterality Date   CORONARY STENT INTERVENTION N/A 12/24/2017   Procedure: CORONARY STENT INTERVENTION;  Surgeon: Lorretta Harp, MD;  Location: [facility];  Service: Cardiovascular;  Laterality: N/A;   CORONARY STENT PLACEMENT  12/24/2017   LEFT HEART CATH AND CORONARY ANGIOGRAPHY N/A 12/24/2017   Procedure: LEFT HEART CATH AND CORONARY ANGIOGRAPHY;  Surgeon: Lorretta Harp, MD;  Location: [place];  Service: Cardiovascular;  Laterality: N/A;   TONSILLECTOMY        Medications: Current Meds  Medication Sig   apixaban (ELIQUIS) 5 MG TABS tablet Take 1 tablet (5 mg total) by mouth 2 (two) times daily.   atorvastatin (LIPITOR) 80 MG tablet Take 1 tablet (80 mg total) by mouth daily at 6 PM.   b complex vitamins capsule Take 1 capsule by mouth daily.   metoprolol tartrate (LOPRESSOR) 25 MG tablet Take 1 tablet (25 mg total) by mouth 2 (two) times daily.   nitroGLYCERIN (NITROSTAT) 0.4 MG SL tablet Place 1 tablet (0.4 mg total) under the tongue every 5 (five) minutes as needed for chest pain.   Probiotic Product (PROBIOTIC PO) Take 1 capsule by mouth in the morning and at bedtime.   Vitamin D, Ergocalciferol, (DRISDOL) 1.25 MG (50000 UNIT) CAPS capsule Take 1 capsule (50,000 Units total) by mouth every 7 (seven) days.     Allergies: Allergies  Allergen Reactions   Penicillins Other (See Comments)    Unknown reaction  DID THE REACTION INVOLVE: Swelling of the face/tongue/throat, SOB, or low BP? Unknown Sudden or severe rash/hives, skin peeling, or the inside of the mouth or nose? Unknown Did it require medical treatment? Unknown When did it last happen?       If all above answers are "NO", may proceed with cephalosporin use.     Social History: The patient  reports that she has quit smoking. Her smoking use included cigarettes. She smoked an average of .5 packs per day. She has never used smokeless tobacco. She reports current alcohol use of about 2.0 - 3.0 standard drinks of alcohol per week. She reports that she does not use drugs.   Family History: The patient's family history includes Alcohol abuse in her mother; Anxiety disorder in her mother; Cancer in her father; Depression in her mother; Diverticulitis in her mother and sister; Healthy in her sister; Kidney disease in her mother; Pancreatic cancer in her father; Thyroid disease in her mother; Uterine cancer in her maternal grandmother.   Review of Systems: Please see the history of  present illness.   All other systems are reviewed and negative.   Physical Exam: VS:  BP 130/76   Pulse 66   Ht '5\' 5"'  (1.651 m)   Wt 128.8 kg   SpO2 97%   BMI 47.26 kg/m  .  BMI Body mass index is 47.26 kg/m.  Wt Readings from Last 3 Encounters:  07/28/20 128.8 kg  04/02/20 126.6 kg  03/29/20 126.1 kg   Affect appropriate Obese  HEENT: normal Neck supple with no adenopathy JVP normal no bruits no thyromegaly Lungs clear with no wheezing and good diaphragmatic motion Heart:  S1/S2 no murmur, no rub, gallop or click PMI normal Abdomen: benighn, BS positve, no tenderness, no AAA no bruit.  No HSM or HJR Distal pulses intact with no bruits No edema Neuro non-focal Skin warm and dry No muscular weakness    LABORATORY DATA:  EKG:  SR LAE nonspecific ST changes 02/16/20   Lab Results  Component Value Date   WBC 7.2 02/16/2020   HGB 14.9 02/16/2020   HCT 44.6 02/16/2020   PLT 325 02/16/2020   GLUCOSE 92 02/16/2020   CHOL 163 02/16/2020   TRIG 79 02/16/2020   HDL 53 02/16/2020   LDLCALC 95 02/16/2020   ALT 19 02/16/2020   AST 19 02/16/2020   NA 142 02/16/2020   K 4.4 02/16/2020   CL 103 02/16/2020   CREATININE 0.77 02/16/2020   BUN 16 02/16/2020   CO2 22 02/16/2020   TSH 2.170 02/16/2020   INR 0.99 12/24/2017   HGBA1C 5.6 02/16/2020     BNP (last 3 results) No results for input(s): BNP in the last 8760 hours.  ProBNP (last 3 results) No results for input(s): PROBNP in the last 8760 hours.   Other Studies Reviewed Today:  CORONARY STENT INTERVENTION 12/2017  LEFT HEART CATH AND CORONARY ANGIOGRAPHY   Conclusion    Prox Cx to Mid Cx lesion is 90% stenosed. Ost LAD to Prox LAD lesion is 30% stenosed. Prox LAD to Mid LAD lesion is 30% stenosed. A drug-eluting stent was successfully placed. Post intervention, there is a 0% residual stenosis.    Echo Study Conclusions 12/2017  - Left ventricle: The cavity size was normal. Wall thickness was     increased in a pattern of mild LVH. Systolic function was    vigorous. The estimated ejection fraction was in the range of 65%    to 70%. Wall motion was normal; there were no regional wall    motion abnormalities. Doppler parameters are consistent with    abnormal left ventricular relaxation (grade 1 diastolic    dysfunction). The E/e&' ratio is <8, suggesting normal LV filling    pressure.  - Mitral valve: Mildly thickened leaflets . There was mild    regurgitation.  - Left  atrium: The atrium was mildly dilated.  - Right atrium: The atrium was mildly dilated.  - Tricuspid valve: There was mild regurgitation.  - Pulmonary arteries: PA peak pressure: 27 mm Hg (S).  - Inferior vena cava: The vessel was normal in size. The    respirophasic diameter changes were in the normal range (>= 50%),    consistent with normal central venous pressure.   Impressions:   - LVEF 65-70%, mild LVH, normal wall motion, grade 1 DD, normal LV    filling pressure, mild MR, mild biatrial enlargement, mild TR,    RVSP 27 mmHg, normal IVC.    ASSESSMENT AND PLAN:   1. New onset AF - back in NSR - sleep study ordered continue eliquis and lopressor   2. CAD - prior MI with DES to [facility] for single vessel disease - 81 mg ASA and  Eliquis per the AF clinic -Myovue normal 03/03/20 [id number] 3. HTN - Well controlled.  Continue current medications and low sodium Dash type diet.    4. HLD - LDL 95 continue high dose lipitor   5. Obesity - now in the Weight Loss clinic - Ok to use Plenity supplement   6. Concern for OSA - sleep study ordered by AF clinic 02/16/20    7. Prior smoker - not smoking. CXR 02/06/19 NAD   8. Arthritis:  ? RA check labs RF, ESR, ANA LA f/u primary   Time: spent reviewing chart myovue, cath, echo direct patient interview and composing note 20 minutes   Current medicines are reviewed with the patient today.  The patient does not have concerns regarding medicines other than what has been  noted above.  The following changes have been made:  See above.  Labs/ tests ordered today include:   Rheumatoid arthritis labs  No orders of the defined types were placed in this encounter.    Disposition:   FU in a year    Patient is agreeable to this plan and will call if any problems develop in the interim.   Signed: Jenkins Rouge, MD  07/28/2020 3:24 PM  [facility] [address] [place], [place]  [address] Phone: [phone] Fax: [phone]

## 2020-07-27 ENCOUNTER — Other Ambulatory Visit: Payer: Self-pay

## 2020-07-27 ENCOUNTER — Other Ambulatory Visit: Payer: Self-pay | Admitting: Pharmacist

## 2020-07-27 MED ORDER — APIXABAN 5 MG PO TABS
5.0000 mg | ORAL_TABLET | Freq: Two times a day (BID) | ORAL | 5 refills | Status: DC
Start: 2020-07-27 — End: 2020-08-17

## 2020-07-27 NOTE — Telephone Encounter (Signed)
Pt returned call, refill sent in.

## 2020-07-27 NOTE — Telephone Encounter (Signed)
Prescription refill request for Eliquis received. Indication: Afib Last office visit: 07/21/20 (Dr Eden Emms) Scr: 0.77 (02/16/20) Age: 65 Weight: 126.6kg  Need to confirm pharmacy

## 2020-07-28 ENCOUNTER — Ambulatory Visit (INDEPENDENT_AMBULATORY_CARE_PROVIDER_SITE_OTHER): Payer: 59 | Admitting: Cardiovascular Disease

## 2020-07-28 ENCOUNTER — Other Ambulatory Visit: Payer: Self-pay

## 2020-07-28 ENCOUNTER — Encounter: Payer: Self-pay | Admitting: Cardiovascular Disease

## 2020-07-28 ENCOUNTER — Other Ambulatory Visit (HOSPITAL_COMMUNITY)
Admission: RE | Admit: 2020-07-28 | Discharge: 2020-07-28 | Disposition: A | Discharge status: Home / Self Care | Payer: 59 | Source: Ambulatory Visit | Attending: Cardiovascular Disease | Admitting: Cardiovascular Disease

## 2020-07-28 VITALS — BP 130/76 | HR 66 | Ht 65.0 in | Wt 284.0 lb

## 2020-07-28 DIAGNOSIS — Z13828 Encounter for screening for other musculoskeletal disorder: Secondary | ICD-10-CM | POA: Diagnosis not present

## 2020-07-28 DIAGNOSIS — I2510 Atherosclerotic heart disease of native coronary artery without angina pectoris: Secondary | ICD-10-CM | POA: Diagnosis not present

## 2020-07-28 DIAGNOSIS — I480 Paroxysmal atrial fibrillation: Secondary | ICD-10-CM

## 2020-07-28 LAB — SEDIMENTATION RATE: Sed Rate: 33 mm/hr — ABNORMAL HIGH (ref 0–22)

## 2020-07-28 MED ORDER — ASPIRIN EC 81 MG PO TBEC: 81.0000 mg | DELAYED_RELEASE_TABLET | Freq: Every day | ORAL | 3 refills | Status: AC

## 2020-07-28 NOTE — Patient Instructions (Signed)
Medication Instructions:  Your physician recommends that you continue on your current medications as directed. Please refer to the Current Medication list given to you today.  Restart Aspirin 81 mg Daily   *If you need a refill on your cardiac medications before your next appointment, please call your pharmacy*   Lab Work: Your physician recommends that you return for lab work today.   If you have labs (blood work) drawn today and your tests are completely normal, you will receive your results only by: MyChart Message (if you have MyChart) OR A paper copy in the mail If you have any lab test that is abnormal or we need to change your treatment, we will call you to review the results.   Testing/Procedures: NONE    Follow-Up: At [facility], you and your health needs are our priority.  As part of our continuing mission to provide you with exceptional heart care, we have created designated Provider Care Teams.  These Care Teams include your primary Cardiologist (physician) and Advanced Practice Providers (APPs -  Physician Assistants and Nurse Practitioners) who all work together to provide you with the care you need, when you need it.  We recommend signing up for the patient portal called "MyChart".  Sign up information is provided on this After Visit Summary.  MyChart is used to connect with patients for Virtual Visits (Telemedicine).  Patients are able to view lab/test results, encounter notes, upcoming appointments, etc.  Non-urgent messages can be sent to your provider as well.   To learn more about what you can do with MyChart, go to [email or web address].    Your next appointment:   1 year(s)  The format for your next appointment:   In Person  Provider:   Charlton Haws, MD   Other Instructions Thank you for choosing [facility]!

## 2020-07-29 LAB — RHEUMATOID FACTOR: Rheumatoid fact SerPl-aCnc: 13.1 IU/mL (ref <14.0)

## 2020-07-29 LAB — ANA: Anti Nuclear Antibody (ANA): NEGATIVE

## 2020-07-29 LAB — LUPUS ANTICOAGULANT PANEL
DRVVT: 45.6 s (ref 0.0–47.0)
PTT Lupus Anticoagulant: 36.6 s (ref 0.0–51.9)

## 2020-08-13 ENCOUNTER — Encounter (HOSPITAL_COMMUNITY): Payer: Self-pay | Admitting: Emergency Medicine

## 2020-08-13 ENCOUNTER — Emergency Department (HOSPITAL_COMMUNITY)
Admission: EM | Admit: 2020-08-13 | Discharge: 2020-08-13 | Disposition: A | Discharge status: Home / Self Care | Payer: 59 | Attending: Student | Admitting: Student

## 2020-08-13 ENCOUNTER — Telehealth: Payer: Self-pay | Admitting: Home Health

## 2020-08-13 ENCOUNTER — Other Ambulatory Visit: Payer: Self-pay

## 2020-08-13 DIAGNOSIS — Z7901 Long term (current) use of anticoagulants: Secondary | ICD-10-CM | POA: Diagnosis not present

## 2020-08-13 DIAGNOSIS — Z955 Presence of coronary angioplasty implant and graft: Secondary | ICD-10-CM | POA: Diagnosis not present

## 2020-08-13 DIAGNOSIS — R030 Elevated blood-pressure reading, without diagnosis of hypertension: Secondary | ICD-10-CM | POA: Diagnosis not present

## 2020-08-13 DIAGNOSIS — Z7982 Long term (current) use of aspirin: Secondary | ICD-10-CM | POA: Insufficient documentation

## 2020-08-13 DIAGNOSIS — I2510 Atherosclerotic heart disease of native coronary artery without angina pectoris: Secondary | ICD-10-CM | POA: Diagnosis not present

## 2020-08-13 DIAGNOSIS — R55 Syncope and collapse: Secondary | ICD-10-CM | POA: Insufficient documentation

## 2020-08-13 DIAGNOSIS — R519 Headache, unspecified: Secondary | ICD-10-CM | POA: Insufficient documentation

## 2020-08-13 DIAGNOSIS — Z87891 Personal history of nicotine dependence: Secondary | ICD-10-CM | POA: Diagnosis not present

## 2020-08-13 DIAGNOSIS — I480 Paroxysmal atrial fibrillation: Secondary | ICD-10-CM | POA: Diagnosis not present

## 2020-08-13 LAB — CBC WITH DIFFERENTIAL/PLATELET
Abs Immature Granulocytes: 0.03 10*3/uL (ref 0.00–0.07)
Basophils Absolute: 0 10*3/uL (ref 0.0–0.1)
Basophils Relative: 1 %
Eosinophils Absolute: 0.2 10*3/uL (ref 0.0–0.5)
Eosinophils Relative: 2 %
HCT: 42.7 % (ref 36.0–46.0)
Hemoglobin: 14.1 g/dL (ref 12.0–15.0)
Immature Granulocytes: 0 %
Lymphocytes Relative: 37 %
Lymphs Abs: 2.9 10*3/uL (ref 0.7–4.0)
MCH: 30.5 pg (ref 26.0–34.0)
MCHC: 33 g/dL (ref 30.0–36.0)
MCV: 92.4 fL (ref 80.0–100.0)
Monocytes Absolute: 0.5 10*3/uL (ref 0.1–1.0)
Monocytes Relative: 7 %
Neutro Abs: 4.1 10*3/uL (ref 1.7–7.7)
Neutrophils Relative %: 53 %
Platelets: 247 10*3/uL (ref 150–400)
RBC: 4.62 MIL/uL (ref 3.87–5.11)
RDW: 13.3 % (ref 11.5–15.5)
WBC: 7.7 10*3/uL (ref 4.0–10.5)
nRBC: 0 % (ref 0.0–0.2)

## 2020-08-13 LAB — COMPREHENSIVE METABOLIC PANEL
ALT: 22 U/L (ref 0–44)
AST: 18 U/L (ref 15–41)
Albumin: 4.2 g/dL (ref 3.5–5.0)
Alkaline Phosphatase: 94 U/L (ref 38–126)
Anion gap: 8 (ref 5–15)
BUN: 21 mg/dL (ref 8–23)
CO2: 26 mmol/L (ref 22–32)
Calcium: 9.3 mg/dL (ref 8.9–10.3)
Chloride: 108 mmol/L (ref 98–111)
Creatinine, Ser: 0.8 mg/dL (ref 0.44–1.00)
GFR, Estimated: >60 mL/min (ref >60)
Glucose, Bld: 98 mg/dL (ref 70–99)
Potassium: 4 mmol/L (ref 3.5–5.1)
Sodium: 142 mmol/L (ref 135–145)
Total Bilirubin: 0.8 mg/dL (ref 0.3–1.2)
Total Protein: 7.1 g/dL (ref 6.5–8.1)

## 2020-08-13 LAB — URINALYSIS, ROUTINE W REFLEX MICROSCOPIC
Bilirubin Urine: NEGATIVE
Glucose, UA: NEGATIVE mg/dL
Hgb urine dipstick: NEGATIVE
Ketones, ur: NEGATIVE mg/dL
Leukocytes,Ua: NEGATIVE
Nitrite: NEGATIVE
Protein, ur: NEGATIVE mg/dL
Specific Gravity, Urine: 1.018 (ref 1.005–1.030)
pH: 5 (ref 5.0–8.0)

## 2020-08-13 MED ORDER — SODIUM CHLORIDE 0.9 % IV BOLUS
1000.0000 mL | Freq: Once | INTRAVENOUS | Status: AC
  Administered 2020-08-13: 1000 mL via INTRAVENOUS

## 2020-08-13 NOTE — Discharge Instructions (Addendum)
You were evaluated at [facility] ED for presyncopal episode (near fainting spell). Likely your high salt intake today caused your blood pressure to increase which can cause symptoms like feeling faint. Watch your salt intake and monitor your blood pressure at home. Follow up with your primary care physician and cardiologist.  Thank you for allowing [place] to be part of your care.
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
## 2020-08-13 NOTE — ED Triage Notes (Signed)
Pt states she got dizzy and felt light headed 7:30pm. Pt denies LOC. Pt states she has a headache. Pt denies weakness.
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
## 2020-08-16 ENCOUNTER — Telehealth: Payer: Self-pay | Admitting: Nurse Practitioner

## 2020-08-16 NOTE — Telephone Encounter (Signed)
Ask how she is feeling since ED visit. If symptoms are persistent, help schedule f/up appt with me. Thank you

## 2020-08-16 NOTE — Telephone Encounter (Signed)
LVM for patient to return call. 

## 2020-08-17 ENCOUNTER — Other Ambulatory Visit (HOSPITAL_COMMUNITY): Payer: Self-pay

## 2020-08-17 MED ORDER — APIXABAN 5 MG PO TABS
5.0000 mg | ORAL_TABLET | Freq: Two times a day (BID) | ORAL | 0 refills | Status: DC
Start: 2020-08-17 — End: 2021-03-14

## 2021-03-12 ENCOUNTER — Other Ambulatory Visit: Payer: Self-pay | Admitting: Cardiovascular Disease

## 2021-03-14 NOTE — Telephone Encounter (Signed)
Pt last saw Dr Eden Emms 04/02/20, pt is overdue for 6 month follow-up.  Recall in Epic sent msg to schedulers.  last labs 08/13/20 Creat 0.80, age 66, weight 128.8kg, based on specified criteria pt is on appropriate dosage of Eliquis 5mg  BID for afib.  Will refill rx x 3 month supply with message on refill must see MD for future refills.  ?

## 2021-05-31 NOTE — Progress Notes (Signed)
CARDIOLOGY OFFICE NOTE  Date:  06/06/2021    Suella Grove Date of Birth: [birth year] Medical Record #[id number]  PCP:  Flossie Buffy, NP  Cardiologist:  Johnsie Cancel  No chief complaint on file.    History of Present Illness: Gina Morris is a 66 y.o. female has a history of CAD with NSTEMI 12/2017 - cath with single vessel DES to mid AV circumflex - normal EF, prior smoker, GERD, and HLD - on statin.   02/16/20 found to be in afib ate weight loss clinic Seen by Fenton in [place] clinic 02/16/20. CHADVASC 4 Noted palpitations, fatigue and dizziness Was back in NSR during his visit Started on eliquis and continued on lopressor d/c ASA Referred for sleep study   Myovue done 03/03/20 normal no ischemia EF 62% TSH normal   Discussed getting back on 81 mg ASA  Discussed ok to use Plenity for weight loss  She wants some testing for RA- has pain/swelling in ankles and hands  07/28/20 LA negative ESR 33 ANA / RF negative   She thinks she goes into PAF for hours at least once a month but as far as I can tell this Is just based on higher HR reading on her watch Discussed Jackelyn Poling monitoring   She also needed educating on role of eliquis for stroke prevention Had been taking only once/day For some time as she thought it was causing more PAF  Discussed being referred to EP for further evaluation for AAT/Ablation     Prep note patient did not answer phone   Past Medical History:  Diagnosis Date   Back pain    Back pain    Baker's cyst of knee, left    Bilateral swelling of feet and ankles    Coronary artery disease    Elevated blood pressure reading 12/23/2017   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   Fatty liver    GERD (gastroesophageal reflux disease)    Joint pain    Morbid obesity ([place])    Myocardial infarction ([place])    Smoking greater than 10 pack years 06/18/2012   Tobacco abuse     Past Surgical History:  Procedure Laterality Date   CORONARY STENT INTERVENTION N/A  12/24/2017   Procedure: CORONARY STENT INTERVENTION;  Surgeon: Lorretta Harp, MD;  Location: [facility];  Service: Cardiovascular;  Laterality: N/A;   CORONARY STENT PLACEMENT  12/24/2017   LEFT HEART CATH AND CORONARY ANGIOGRAPHY N/A 12/24/2017   Procedure: LEFT HEART CATH AND CORONARY ANGIOGRAPHY;  Surgeon: Lorretta Harp, MD;  Location: [facility];  Service: Cardiovascular;  Laterality: N/A;   TONSILLECTOMY       Medications: Current Meds  Medication Sig   apixaban (ELIQUIS) 5 MG TABS tablet Take 1 tablet (5 mg total) by mouth 2 (two) times daily. OVERDUE for follow-up, MUST see provider for FUTURE refills.   aspirin EC 81 MG tablet Take 1 tablet (81 mg total) by mouth daily. Swallow whole.   atorvastatin (LIPITOR) 80 MG tablet Take 1 tablet (80 mg total) by mouth daily at 6 PM.   b complex vitamins capsule Take 1 capsule by mouth daily.   metoprolol tartrate (LOPRESSOR) 25 MG tablet Take 1 tablet (25 mg total) by mouth 2 (two) times daily.   nitroGLYCERIN (NITROSTAT) 0.4 MG SL tablet Place 1 tablet (0.4 mg total) under the tongue every 5 (five) minutes as needed for chest pain.   Probiotic Product (PROBIOTIC PO) Take 1 capsule by  mouth in the morning and at bedtime.   Vitamin D, Ergocalciferol, (DRISDOL) 1.25 MG (50000 UNIT) CAPS capsule Take 1 capsule (50,000 Units total) by mouth every 7 (seven) days.     Allergies: Allergies  Allergen Reactions   Penicillins Other (See Comments)    Unknown reaction  DID THE REACTION INVOLVE: Swelling of the face/tongue/throat, SOB, or low BP? Unknown Sudden or severe rash/hives, skin peeling, or the inside of the mouth or nose? Unknown Did it require medical treatment? Unknown When did it last happen?       If all above answers are "NO", may proceed with cephalosporin use.     Social History: The patient  reports that she has quit smoking. Her smoking use included cigarettes. She smoked an average of .5 packs per  day. She has never used smokeless tobacco. She reports current alcohol use of about 2.0 - 3.0 standard drinks per week. She reports that she does not use drugs.   Family History: The patient's family history includes Alcohol abuse in her mother; Anxiety disorder in her mother; Cancer in her father; Depression in her mother; Diverticulitis in her mother and sister; Healthy in her sister; Kidney disease in her mother; Pancreatic cancer in her father; Thyroid disease in her mother; Uterine cancer in her maternal grandmother.   Review of Systems: Please see the history of present illness.   All other systems are reviewed and negative.   Physical Exam: VS:  BP 104/70   Pulse (!) 52   Ht '5\' 5"'  (1.651 m)   Wt 273 lb (123.8 kg)   SpO2 96%   BMI 45.43 kg/m  .  BMI Body mass index is 45.43 kg/m.  Wt Readings from Last 3 Encounters:  06/06/21 273 lb (123.8 kg)  08/13/20 284 lb (128.8 kg)  07/28/20 284 lb (128.8 kg)   Affect appropriate Obese  HEENT: normal Neck supple with no adenopathy JVP normal no bruits no thyromegaly Lungs clear with no wheezing and good diaphragmatic motion Heart:  S1/S2 no murmur, no rub, gallop or click PMI normal Abdomen: benighn, BS positve, no tenderness, no AAA no bruit.  No HSM or HJR Distal pulses intact with no bruits No edema Neuro non-focal Skin warm and dry No muscular weakness    LABORATORY DATA:  EKG:  SR LAE nonspecific ST changes 02/16/20   Lab Results  Component Value Date   WBC 7.7 08/13/2020   HGB 14.1 08/13/2020   HCT 42.7 08/13/2020   PLT 247 08/13/2020   GLUCOSE 98 08/13/2020   CHOL 163 02/16/2020   TRIG 79 02/16/2020   HDL 53 02/16/2020   LDLCALC 95 02/16/2020   ALT 22 08/13/2020   AST 18 08/13/2020   NA 142 08/13/2020   K 4.0 08/13/2020   CL 108 08/13/2020   CREATININE 0.80 08/13/2020   BUN 21 08/13/2020   CO2 26 08/13/2020   TSH 2.170 02/16/2020   INR 0.99 12/24/2017   HGBA1C 5.6 02/16/2020     BNP (last 3  results) No results for input(s): BNP in the last 8760 hours.  ProBNP (last 3 results) No results for input(s): PROBNP in the last 8760 hours.   Other Studies Reviewed Today:  CORONARY STENT INTERVENTION 12/2017  LEFT HEART CATH AND CORONARY ANGIOGRAPHY   Conclusion    Prox Cx to Mid Cx lesion is 90% stenosed. Ost LAD to Prox LAD lesion is 30% stenosed. Prox LAD to Mid LAD lesion is 30% stenosed. A drug-eluting stent was successfully  placed. Post intervention, there is a 0% residual stenosis.    Echo Study Conclusions 12/2017  - Left ventricle: The cavity size was normal. Wall thickness was    increased in a pattern of mild LVH. Systolic function was    vigorous. The estimated ejection fraction was in the range of 65%    to 70%. Wall motion was normal; there were no regional wall    motion abnormalities. Doppler parameters are consistent with    abnormal left ventricular relaxation (grade 1 diastolic    dysfunction). The E/e&' ratio is <8, suggesting normal LV filling    pressure.  - Mitral valve: Mildly thickened leaflets . There was mild    regurgitation.  - Left atrium: The atrium was mildly dilated.  - Right atrium: The atrium was mildly dilated.  - Tricuspid valve: There was mild regurgitation.  - Pulmonary arteries: PA peak pressure: 27 mm Hg (S).  - Inferior vena cava: The vessel was normal in size. The    respirophasic diameter changes were in the normal range (>= 50%),    consistent with normal central venous pressure.   Impressions:   - LVEF 65-70%, mild LVH, normal wall motion, grade 1 DD, normal LV    filling pressure, mild MR, mild biatrial enlargement, mild TR,    RVSP 27 mmHg, normal IVC.    ASSESSMENT AND PLAN:   1. New onset AF - back in NSR - sleep study ordered continue eliquis and lopressor refer to EP for consideration AAT/Ablation K[place] monitoring to document her feelings of recurrent PAF   2. CAD - prior MI with DES to [facility] for single  vessel disease - 81 mg ASA and  Eliquis per the AF clinic -Myovue normal 03/03/20 [id number] 3. HTN - Well controlled.  Continue current medications and low sodium Dash type diet.    4. HLD - LDL 95 continue high dose lipitor   5. Obesity - now in the Weight Loss clinic - Ok to use Plenity supplement   6. Concern for OSA - sleep study ordered by AF clinic 02/16/20  pending still   7. Prior smoker - not smoking. CXR 02/06/19 NAD Has had 4 mm lung nodules in RUL by CT 2015 with paraseptal emphysema will update CT   8. Arthritis:  ?  F/u primary auto immune labs benign   Time: spent reviewing chart myovue, cath, echo direct patient interview and composing note 20 minutes   Current medicines are reviewed with the patient today.  The patient does not have concerns regarding medicines other than what has been noted above.  The following changes have been made:  See above.  Labs/ tests ordered today include:   Lung cancer CT     No orders of the defined types were placed in this encounter.     Disposition:   FU in a year F/U EP next available    Patient is agreeable to this plan and will call if any problems develop in the interim.   Signed: Jenkins Rouge, MD  06/06/2021 9:08 AM  [facility] [address] [place], [place]  [address] Phone: [phone] Fax: [phone]

## 2021-06-06 ENCOUNTER — Ambulatory Visit (INDEPENDENT_AMBULATORY_CARE_PROVIDER_SITE_OTHER): Payer: BC Managed Care – PPO | Admitting: Cardiovascular Disease

## 2021-06-06 ENCOUNTER — Encounter: Payer: Self-pay | Admitting: Cardiovascular Disease

## 2021-06-06 VITALS — BP 104/70 | HR 52 | Ht 65.0 in | Wt 273.0 lb

## 2021-06-06 DIAGNOSIS — I10 Essential (primary) hypertension: Secondary | ICD-10-CM

## 2021-06-06 DIAGNOSIS — Z87891 Personal history of nicotine dependence: Secondary | ICD-10-CM | POA: Diagnosis not present

## 2021-06-06 DIAGNOSIS — E782 Mixed hyperlipidemia: Secondary | ICD-10-CM

## 2021-06-06 DIAGNOSIS — I480 Paroxysmal atrial fibrillation: Secondary | ICD-10-CM | POA: Diagnosis not present

## 2021-06-06 DIAGNOSIS — R918 Other nonspecific abnormal finding of lung field: Secondary | ICD-10-CM

## 2021-06-06 LAB — CBC WITH DIFFERENTIAL/PLATELET
Basophils Absolute: 0.1 10*3/uL (ref 0.0–0.2)
Basos: 1 %
EOS (ABSOLUTE): 0.2 10*3/uL (ref 0.0–0.4)
Eos: 2 %
Hematocrit: 41.9 % (ref 34.0–46.6)
Hemoglobin: 14.5 g/dL (ref 11.1–15.9)
Immature Grans (Abs): 0 10*3/uL (ref 0.0–0.1)
Immature Granulocytes: 0 %
Lymphocytes Absolute: 2.6 10*3/uL (ref 0.7–3.1)
Lymphs: 34 %
MCH: 31.3 pg (ref 26.6–33.0)
MCHC: 34.6 g/dL (ref 31.5–35.7)
MCV: 91 fL (ref 79–97)
Monocytes Absolute: 0.5 10*3/uL (ref 0.1–0.9)
Monocytes: 6 %
Neutrophils Absolute: 4.2 10*3/uL (ref 1.4–7.0)
Neutrophils: 57 %
Platelets: 288 10*3/uL (ref 150–450)
RBC: 4.63 x10E6/uL (ref 3.77–5.28)
RDW: 13.1 % (ref 11.7–15.4)
WBC: 7.5 10*3/uL (ref 3.4–10.8)

## 2021-06-06 LAB — BASIC METABOLIC PANEL
BUN/Creatinine Ratio: 15 (ref 12–28)
BUN: 13 mg/dL (ref 8–27)
CO2: 24 mmol/L (ref 20–29)
Calcium: 9.5 mg/dL (ref 8.7–10.3)
Chloride: 103 mmol/L (ref 96–106)
Creatinine, Ser: 0.87 mg/dL (ref 0.57–1.00)
Glucose: 108 mg/dL — ABNORMAL HIGH (ref 70–99)
Potassium: 4.4 mmol/L (ref 3.5–5.2)
Sodium: 140 mmol/L (ref 134–144)
eGFR: 73 mL/min/{1.73_m2} (ref >59)

## 2021-06-06 MED ORDER — NITROGLYCERIN 0.4 MG SL SUBL: 0.4000 mg | SUBLINGUAL_TABLET | SUBLINGUAL | 0 refills | Status: DC | PRN

## 2021-06-06 MED ORDER — METOPROLOL TARTRATE 25 MG PO TABS: 25.0000 mg | ORAL_TABLET | Freq: Two times a day (BID) | ORAL | 2 refills | Status: DC

## 2021-06-06 MED ORDER — APIXABAN 5 MG PO TABS: 5.0000 mg | ORAL_TABLET | Freq: Two times a day (BID) | ORAL | 3 refills | Status: DC

## 2021-06-06 MED ORDER — ATORVASTATIN CALCIUM 80 MG PO TABS: 80.0000 mg | ORAL_TABLET | Freq: Every day | ORAL | 2 refills | Status: DC

## 2021-06-06 NOTE — Patient Instructions (Addendum)
Medication Instructions:  Your physician recommends that you continue on your current medications as directed. Please refer to the Current Medication list given to you today.  *If you need a refill on your cardiac medications before your next appointment, please call your pharmacy*  Lab Work: Your physician recommends that you have lab work today- BMET and CBC  If you have labs (blood work) drawn today and your tests are completely normal, you will receive your results only by: MyChart Message (if you have MyChart) OR A paper copy in the mail If you have any lab test that is abnormal or we need to change your treatment, we will call you to review the results.  Testing/Procedures: Non-Cardiac CT scanning for cancer screening, (CAT scanning), is a noninvasive, special x-ray that produces cross-sectional images of the body using x-rays and a computer. CT scans help physicians diagnose and treat medical conditions. For some CT exams, a contrast material is used to enhance visibility in the area of the body being studied. CT scans provide greater clarity and reveal more details than regular x-ray exams.    Follow-Up: At [facility], you and your health needs are our priority.  As part of our continuing mission to provide you with exceptional heart care, we have created designated Provider Care Teams.  These Care Teams include your primary Cardiologist (physician) and Advanced Practice Providers (APPs -  Physician Assistants and Nurse Practitioners) who all work together to provide you with the care you need, when you need it.  We recommend signing up for the patient portal called "MyChart".  Sign up information is provided on this After Visit Summary.  MyChart is used to connect with patients for Virtual Visits (Telemedicine).  Patients are able to view lab/test results, encounter notes, upcoming appointments, etc.  Non-urgent messages can be sent to your provider as well.   To learn more about  what you can do with MyChart, go to [email or web address].    Your next appointment:   1 year(s)  The format for your next appointment:   In Person  Provider:   Charlton Haws, MD {  You have been referred to Dr. Lalla Brothers to discuss ablation.   Important Information About Sugar

## 2021-06-13 ENCOUNTER — Ambulatory Visit (INDEPENDENT_AMBULATORY_CARE_PROVIDER_SITE_OTHER)
Admission: RE | Admit: 2021-06-13 | Discharge: 2021-06-13 | Disposition: A | Discharge status: Home / Self Care | Payer: BC Managed Care – PPO | Source: Ambulatory Visit | Attending: Cardiovascular Disease | Admitting: Cardiovascular Disease

## 2021-06-13 DIAGNOSIS — R918 Other nonspecific abnormal finding of lung field: Secondary | ICD-10-CM | POA: Diagnosis not present

## 2021-06-13 DIAGNOSIS — Z87891 Personal history of nicotine dependence: Secondary | ICD-10-CM

## 2021-07-18 ENCOUNTER — Ambulatory Visit: Payer: BC Managed Care – PPO | Admitting: Cardiology

## 2021-08-10 ENCOUNTER — Encounter (INDEPENDENT_AMBULATORY_CARE_PROVIDER_SITE_OTHER): Payer: Self-pay

## 2021-12-13 DIAGNOSIS — M25511 Pain in right shoulder: Secondary | ICD-10-CM | POA: Diagnosis not present

## 2021-12-20 DIAGNOSIS — M25511 Pain in right shoulder: Secondary | ICD-10-CM | POA: Diagnosis not present

## 2021-12-28 DIAGNOSIS — Z1231 Encounter for screening mammogram for malignant neoplasm of breast: Secondary | ICD-10-CM | POA: Diagnosis not present

## 2022-01-05 ENCOUNTER — Other Ambulatory Visit: Payer: Self-pay | Admitting: Family Medicine

## 2022-01-05 DIAGNOSIS — Z1231 Encounter for screening mammogram for malignant neoplasm of breast: Secondary | ICD-10-CM

## 2022-01-10 DIAGNOSIS — M25561 Pain in right knee: Secondary | ICD-10-CM | POA: Diagnosis not present

## 2022-01-10 DIAGNOSIS — M25511 Pain in right shoulder: Secondary | ICD-10-CM | POA: Diagnosis not present

## 2022-01-10 DIAGNOSIS — M25562 Pain in left knee: Secondary | ICD-10-CM | POA: Diagnosis not present

## 2022-01-17 ENCOUNTER — Other Ambulatory Visit: Payer: Self-pay | Admitting: Family Medicine

## 2022-01-17 ENCOUNTER — Telehealth: Payer: Self-pay

## 2022-01-17 ENCOUNTER — Ambulatory Visit
Admission: RE | Admit: 2022-01-17 | Discharge: 2022-01-17 | Disposition: A | Discharge status: Home / Self Care | Payer: BC Managed Care – PPO | Source: Ambulatory Visit | Attending: Family Medicine | Admitting: Family Medicine

## 2022-01-17 DIAGNOSIS — Z1231 Encounter for screening mammogram for malignant neoplasm of breast: Secondary | ICD-10-CM

## 2022-01-17 DIAGNOSIS — S39012D Strain of muscle, fascia and tendon of lower back, subsequent encounter: Secondary | ICD-10-CM | POA: Diagnosis not present

## 2022-01-17 DIAGNOSIS — M6281 Muscle weakness (generalized): Secondary | ICD-10-CM | POA: Diagnosis not present

## 2022-01-17 DIAGNOSIS — R921 Mammographic calcification found on diagnostic imaging of breast: Secondary | ICD-10-CM

## 2022-01-17 NOTE — Telephone Encounter (Signed)
   Name: Gina Morris  DOB: 10/03/1955  MRN: [id number]  Primary Cardiologist: Jenkins Rouge, MD   Preoperative team, please contact this patient and set up a phone call appointment for further preoperative risk assessment. Please obtain consent and complete medication review. Thank you for your help.  I confirm that guidance regarding antiplatelet and oral anticoagulation therapy has been completed and, if necessary, noted below.  Per office protocol, patient can hold Eliquis for 2-3 days prior to procedure.      Mable Fill, Marissa Nestle, NP 01/17/2022, [place]

## 2022-01-17 NOTE — Telephone Encounter (Signed)
Left message to call back. Pt needs preop [facility] appt for Gina Morris is MD

## 2022-01-17 NOTE — Telephone Encounter (Signed)
   Pre-operative Risk Assessment    Patient Name: Gina Morris  DOB: 1955-10-11 MRN: [id number]   Request for Surgical Clearance    Procedure:   RIGHT SHOULDER SCOPE, ROTATOR CUFF REPAIR  Date of Surgery:  Clearance TBD                                 Surgeon:  Ophelia Charter, MD Surgeon's Group or Practice Name:  [facility]  Phone number:  [phone] [place] Fax number:  [phone]    Type of Clearance Requested:   - Medical  - Pharmacy:  Hold Apixaban (Eliquis)     Type of Anesthesia:   GENERAL W/INTERSCALENE BLOCK   Additional requests/questions:

## 2022-01-17 NOTE — Telephone Encounter (Signed)
Patient with diagnosis of afib on Eliquis for anticoagulation.    Procedure: right shoulder scope with rotator cuff repair   Date of procedure: TBD  CHA2DS2-VASc Score = 4  This indicates a 4.8% annual risk of stroke. The patient's score is based upon: CHF History: 0 HTN History: 1 Diabetes History: 0 Stroke History: 0 Vascular Disease History: 1 Age Score: 1 Gender Score: 1   CrCl 57mL/min using adjusted body weight Platelet count 288K  Per office protocol, patient can hold Eliquis for 2-3 days prior to procedure.    **This guidance is not considered finalized until pre-operative APP has relayed final recommendations.**

## 2022-01-17 NOTE — Telephone Encounter (Signed)
Pharmacy please advise on holding Eliquis prior to right shoulder scope with rotator cuff repair scheduled for TBD. Thank you.

## 2022-01-19 NOTE — Telephone Encounter (Signed)
Pt returning call

## 2022-01-19 NOTE — Telephone Encounter (Signed)
Called pt regarding surgical clearance and the need for a tele visit.  Pt stated she will need to call back.

## 2022-01-19 NOTE — Telephone Encounter (Signed)
Returned call to pt.  She is actually going for a Breast Biopsy next week and haven't scheduled the shoulder surgery as of yet.  Pt will call us back once she schedules it to arrange a tele visit.  Did go ahead and make pt's yearly appointment with Dr. Johnsie Cancel for her in June 2024.   I will route back to the requesting surgeon's office to make them aware.

## 2022-01-25 ENCOUNTER — Ambulatory Visit
Admission: RE | Admit: 2022-01-25 | Discharge: 2022-01-25 | Disposition: A | Discharge status: Home / Self Care | Payer: BC Managed Care – PPO | Source: Ambulatory Visit | Attending: Family Medicine | Admitting: Family Medicine

## 2022-01-25 ENCOUNTER — Other Ambulatory Visit: Payer: Self-pay | Admitting: Family Medicine

## 2022-01-25 DIAGNOSIS — R921 Mammographic calcification found on diagnostic imaging of breast: Secondary | ICD-10-CM

## 2022-01-25 DIAGNOSIS — N6489 Other specified disorders of breast: Secondary | ICD-10-CM | POA: Diagnosis not present

## 2022-01-25 HISTORY — PX: BREAST BIOPSY: SHX20

## 2022-01-31 DIAGNOSIS — M25561 Pain in right knee: Secondary | ICD-10-CM | POA: Diagnosis not present

## 2022-02-01 DIAGNOSIS — S39012D Strain of muscle, fascia and tendon of lower back, subsequent encounter: Secondary | ICD-10-CM | POA: Diagnosis not present

## 2022-02-01 DIAGNOSIS — M6281 Muscle weakness (generalized): Secondary | ICD-10-CM | POA: Diagnosis not present

## 2022-02-08 DIAGNOSIS — S39012D Strain of muscle, fascia and tendon of lower back, subsequent encounter: Secondary | ICD-10-CM | POA: Diagnosis not present

## 2022-02-08 DIAGNOSIS — M6281 Muscle weakness (generalized): Secondary | ICD-10-CM | POA: Diagnosis not present

## 2022-02-13 DIAGNOSIS — R7303 Prediabetes: Secondary | ICD-10-CM | POA: Insufficient documentation

## 2022-02-20 ENCOUNTER — Telehealth: Payer: Self-pay | Admitting: Cardiovascular Disease

## 2022-02-20 DIAGNOSIS — U071 COVID-19: Secondary | ICD-10-CM | POA: Diagnosis not present

## 2022-02-20 DIAGNOSIS — R519 Headache, unspecified: Secondary | ICD-10-CM | POA: Diagnosis not present

## 2022-02-20 DIAGNOSIS — R051 Acute cough: Secondary | ICD-10-CM | POA: Diagnosis not present

## 2022-02-20 NOTE — Telephone Encounter (Signed)
Pt c/o medication issue:  1. Name of Medication: Paxlovid   2. How are you currently taking this medication (dosage and times per day)? Not currently taking  3. Are you having a reaction (difficulty breathing--STAT)? No   4. What is your medication issue? Patient is calling stating she tested positive for Covid and is wanting to know if it's okay for her to take this medication. Please advise.

## 2022-02-20 NOTE — Telephone Encounter (Signed)
Spoke to patient, Paxlovid interact with Eliquis and Lipitor. Discussed the management, cut down the Eliquis dose in the half and stop Lipitor while on Paxlovid. Molnupiravir can be alternative to Paxlovid which does not interact with her current medications. Patient states her symptoms are not that severe so does not want to go with Paxlovid treatment but  she will reach out to PCP for [facility]

## 2022-02-23 ENCOUNTER — Other Ambulatory Visit: Payer: Self-pay

## 2022-02-23 MED ORDER — ATORVASTATIN CALCIUM 80 MG PO TABS: 80.0000 mg | ORAL_TABLET | Freq: Every day | ORAL | 1 refills | Status: DC

## 2022-02-26 ENCOUNTER — Encounter: Payer: Self-pay | Admitting: Cardiovascular Disease

## 2022-02-27 MED ORDER — METOPROLOL TARTRATE 25 MG PO TABS: 25.0000 mg | ORAL_TABLET | Freq: Two times a day (BID) | ORAL | 3 refills | Status: DC

## 2022-02-27 MED ORDER — ATORVASTATIN CALCIUM 80 MG PO TABS: 80.0000 mg | ORAL_TABLET | Freq: Every day | ORAL | 3 refills | Status: DC

## 2022-02-27 MED ORDER — APIXABAN 5 MG PO TABS: 5.0000 mg | ORAL_TABLET | Freq: Two times a day (BID) | ORAL | 3 refills | Status: DC

## 2022-03-01 NOTE — Telephone Encounter (Signed)
Surgery date is 04/03/22

## 2022-03-01 NOTE — Telephone Encounter (Signed)
Received surgery clearance as below.  Will send to preop to address, as we were aware pt was going to call back when she was ready.

## 2022-03-10 ENCOUNTER — Telehealth: Payer: Self-pay | Admitting: Cardiovascular Disease

## 2022-03-10 NOTE — Telephone Encounter (Signed)
Spoke with patient who is agreeable to do a tele visit on 3/22 at 9:20 am. Med rec and consent have been done.

## 2022-03-10 NOTE — Telephone Encounter (Signed)
Returned patient and scheduled her for a tele visit for pre-op clearance (see clearance note).

## 2022-03-10 NOTE — Telephone Encounter (Signed)
Patient is follow-up to get a update on the status of her clearance and is concerned as her surgery is now scheduled for 4/1.

## 2022-03-24 ENCOUNTER — Ambulatory Visit: Payer: BC Managed Care – PPO | Attending: Cardiovascular Disease

## 2022-03-24 DIAGNOSIS — Z01810 Encounter for preprocedural cardiovascular examination: Secondary | ICD-10-CM | POA: Diagnosis not present

## 2022-03-24 NOTE — Progress Notes (Signed)
Virtual Visit via Telephone Note   Because of Gina Morris's co-morbid illnesses, she is at least at moderate risk for complications without adequate follow up.  This format is felt to be most appropriate for this patient at this time.  The patient did not have access to video technology/had technical difficulties with video requiring transitioning to audio format only (telephone).  All issues noted in this document were discussed and addressed.  No physical exam could be performed with this format.  Please refer to the patient's chart for her consent to telehealth for [facility].  Evaluation Performed:  Preoperative cardiovascular risk assessment _____________   Date:  03/24/2022   Patient ID:  [name], DOB August 11, 1955, MRN [id number] Patient Location:  Home Provider location:   Office  Primary Care Provider:  Flossie Buffy, NP Primary Cardiologist:  Jenkins Rouge, MD  Chief Complaint / Patient Profile   67 y.o. y/o female with a h/o CAD with NSTEMI 12/2017, cath with single-vessel DES to mid AV circumflex, normal EF, prior smoker, GERD, HLD (on statin) who is pending right shoulder scope, rotator cuff repair and presents today for telephonic preoperative cardiovascular risk assessment.  History of Present Illness    Gina Morris is a 67 y.o. female who presents via audio/video conferencing for a telehealth visit today.  Pt was last seen in cardiology clinic on 06/06/21 by Dr. Johnsie Cancel.  At that time LAFRANCE SCHWICKERATH was doing well.  Referral to EP for further evaluation of AT/ablation was discussed.  The patient is now pending procedure as outlined above. Since her last visit, she tells me that she has been doing fairly well.  She does occasionally have episodes of A-fib and she has been trying to pinpoint what irritates her heart.  She has noticed that higher sodium foods and processed foods tend to be more bothersome to her.  She has made dramatic dietary  changes and completely cut out these foods which has definitely helped with her heart rhythm.  She also has started taking her metoprolol promptly at 6 PM which is roughly 12 hours after her initial dose.  This has also helped.  She stated that she is overweight and has some swelling in her legs.  She hates wearing compression socks/stockings because they are uncomfortable.  Occasionally her legs feel heavy and her knees hurt which limits her activity.  She has requested a venous insufficiency workup and assessment which can be done at her June appointment with Dr. Johnsie Cancel.    Per office protocol, patient can hold Eliquis for 2-3 days prior to procedure.   She can resume when medically safe to do so.  Past Medical History    Past Medical History:  Diagnosis Date   Back pain    Back pain    Baker's cyst of knee, left    Bilateral swelling of feet and ankles    Coronary artery disease    Elevated blood pressure reading 12/23/2017   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   Fatty liver    GERD (gastroesophageal reflux disease)    Joint pain    Morbid obesity ([facility])    Myocardial infarction ([place])    Smoking greater than 10 pack years 06/18/2012   Tobacco abuse    Past Surgical History:  Procedure Laterality Date   BREAST BIOPSY Left 01/25/2022   MM LT BREAST BX W LOC DEV 1ST LESION IMAGE BX SPEC STEREO GUIDE 01/25/2022 [facility]   BREAST BIOPSY Left 01/25/2022  MM LT BREAST BX W LOC DEV EA AD LESION IMG BX SPEC STEREO GUIDE 01/25/2022 [facility]   CORONARY STENT INTERVENTION N/A 12/24/2017   Procedure: CORONARY STENT INTERVENTION;  Surgeon: Lorretta Harp, MD;  Location: [place] [facility];  Service: Cardiovascular;  Laterality: N/A;   CORONARY STENT PLACEMENT  12/24/2017   LEFT HEART CATH AND CORONARY ANGIOGRAPHY N/A 12/24/2017   Procedure: LEFT HEART CATH AND CORONARY ANGIOGRAPHY;  Surgeon: Lorretta Harp, MD;  Location: [place] [facility];  Service: Cardiovascular;  Laterality:  N/A;   TONSILLECTOMY      Allergies  Allergies  Allergen Reactions   Penicillins Other (See Comments)    Unknown reaction  DID THE REACTION INVOLVE: Swelling of the face/tongue/throat, SOB, or low BP? Unknown Sudden or severe rash/hives, skin peeling, or the inside of the mouth or nose? Unknown Did it require medical treatment? Unknown When did it last happen?       If all above answers are "NO", may proceed with cephalosporin use.     Home Medications    Prior to Admission medications   Medication Sig Start Date End Date Taking? Authorizing Provider  apixaban (ELIQUIS) 5 MG TABS tablet Take 1 tablet (5 mg total) by mouth 2 (two) times daily. 02/27/22   Josue Hector, MD  aspirin EC 81 MG tablet Take 1 tablet (81 mg total) by mouth daily. Swallow whole. 07/28/20   Josue Hector, MD  atorvastatin (LIPITOR) 80 MG tablet Take 1 tablet (80 mg total) by mouth daily at 6 PM. 02/27/22   Josue Hector, MD  b complex vitamins capsule Take 1 capsule by mouth daily.    [provider]  metoprolol tartrate (LOPRESSOR) 25 MG tablet Take 1 tablet (25 mg total) by mouth 2 (two) times daily. 02/27/22   Josue Hector, MD  nitroGLYCERIN (NITROSTAT) 0.4 MG SL tablet Place 1 tablet (0.4 mg total) under the tongue every 5 (five) minutes as needed for chest pain. 06/06/21   Josue Hector, MD  Probiotic Product (PROBIOTIC PO) Take 1 capsule by mouth in the morning and at bedtime.    [provider]  Vitamin D, Ergocalciferol, (DRISDOL) 1.25 MG (50000 UNIT) CAPS capsule Take 1 capsule (50,000 Units total) by mouth every 7 (seven) days. Patient not taking: Reported on 03/10/2022 03/29/20   Laqueta Linden, MD    Physical Exam    Vital Signs:  TAKERA SINGLETERRY does not have vital signs available for review today.  Given telephonic nature of communication, physical exam is limited. AAOx3. NAD. Normal affect.  Speech and respirations are unlabored.  Accessory Clinical Findings     None  Assessment & Plan    1.  Preoperative Cardiovascular Risk Assessment:  Ms. Labrake perioperative risk of a major cardiac event is 0.9% according to the Revised Cardiac Risk Index (RCRI).  Therefore, she is at low risk for perioperative complications.   Her functional capacity is good at 5.81 METs according to the Duke Activity Status Index (DASI). Recommendations: According to ACC/AHA guidelines, no further cardiovascular testing needed.  The patient may proceed to surgery at acceptable risk.   Antiplatelet and/or Anticoagulation Recommendations:  Eliquis (Apixaban) can be held for 2-3 days prior to surgery.  Please resume post op when felt to be safe.     The patient was advised that if she develops new symptoms prior to surgery to contact our office to arrange for a follow-up visit, and she verbalized understanding.  A copy of this note will be routed to requesting surgeon.  Time:   Today, I have spent 14 minutes with the patient with telehealth technology discussing medical history, symptoms, and management plan.     Elgie Collard, PA-C  03/24/2022, 7:37 AM

## 2022-04-03 DIAGNOSIS — Y999 Unspecified external cause status: Secondary | ICD-10-CM | POA: Diagnosis not present

## 2022-04-03 DIAGNOSIS — M75121 Complete rotator cuff tear or rupture of right shoulder, not specified as traumatic: Secondary | ICD-10-CM | POA: Diagnosis not present

## 2022-04-03 DIAGNOSIS — S46011A Strain of muscle(s) and tendon(s) of the rotator cuff of right shoulder, initial encounter: Secondary | ICD-10-CM | POA: Diagnosis not present

## 2022-04-03 DIAGNOSIS — M24111 Other articular cartilage disorders, right shoulder: Secondary | ICD-10-CM | POA: Diagnosis not present

## 2022-04-03 DIAGNOSIS — X58XXXA Exposure to other specified factors, initial encounter: Secondary | ICD-10-CM | POA: Diagnosis not present

## 2022-04-03 DIAGNOSIS — M19011 Primary osteoarthritis, right shoulder: Secondary | ICD-10-CM | POA: Diagnosis not present

## 2022-04-03 DIAGNOSIS — S46211A Strain of muscle, fascia and tendon of other parts of biceps, right arm, initial encounter: Secondary | ICD-10-CM | POA: Diagnosis not present

## 2022-04-03 DIAGNOSIS — S43431A Superior glenoid labrum lesion of right shoulder, initial encounter: Secondary | ICD-10-CM | POA: Diagnosis not present

## 2022-04-03 DIAGNOSIS — M7521 Bicipital tendinitis, right shoulder: Secondary | ICD-10-CM | POA: Diagnosis not present

## 2022-04-03 DIAGNOSIS — M7541 Impingement syndrome of right shoulder: Secondary | ICD-10-CM | POA: Diagnosis not present

## 2022-04-03 DIAGNOSIS — G8918 Other acute postprocedural pain: Secondary | ICD-10-CM | POA: Diagnosis not present

## 2022-04-11 DIAGNOSIS — M6281 Muscle weakness (generalized): Secondary | ICD-10-CM | POA: Diagnosis not present

## 2022-04-11 DIAGNOSIS — M25611 Stiffness of right shoulder, not elsewhere classified: Secondary | ICD-10-CM | POA: Diagnosis not present

## 2022-04-11 DIAGNOSIS — M75121 Complete rotator cuff tear or rupture of right shoulder, not specified as traumatic: Secondary | ICD-10-CM | POA: Diagnosis not present

## 2022-04-11 DIAGNOSIS — M19011 Primary osteoarthritis, right shoulder: Secondary | ICD-10-CM | POA: Diagnosis not present

## 2022-04-13 DIAGNOSIS — M75121 Complete rotator cuff tear or rupture of right shoulder, not specified as traumatic: Secondary | ICD-10-CM | POA: Diagnosis not present

## 2022-04-13 DIAGNOSIS — M25611 Stiffness of right shoulder, not elsewhere classified: Secondary | ICD-10-CM | POA: Diagnosis not present

## 2022-04-13 DIAGNOSIS — M6281 Muscle weakness (generalized): Secondary | ICD-10-CM | POA: Diagnosis not present

## 2022-04-17 DIAGNOSIS — M25611 Stiffness of right shoulder, not elsewhere classified: Secondary | ICD-10-CM | POA: Diagnosis not present

## 2022-04-17 DIAGNOSIS — M75121 Complete rotator cuff tear or rupture of right shoulder, not specified as traumatic: Secondary | ICD-10-CM | POA: Diagnosis not present

## 2022-04-17 DIAGNOSIS — M6281 Muscle weakness (generalized): Secondary | ICD-10-CM | POA: Diagnosis not present

## 2022-04-19 DIAGNOSIS — M6281 Muscle weakness (generalized): Secondary | ICD-10-CM | POA: Diagnosis not present

## 2022-04-19 DIAGNOSIS — M25611 Stiffness of right shoulder, not elsewhere classified: Secondary | ICD-10-CM | POA: Diagnosis not present

## 2022-04-19 DIAGNOSIS — M75121 Complete rotator cuff tear or rupture of right shoulder, not specified as traumatic: Secondary | ICD-10-CM | POA: Diagnosis not present

## 2022-04-24 DIAGNOSIS — M25611 Stiffness of right shoulder, not elsewhere classified: Secondary | ICD-10-CM | POA: Diagnosis not present

## 2022-04-24 DIAGNOSIS — M75121 Complete rotator cuff tear or rupture of right shoulder, not specified as traumatic: Secondary | ICD-10-CM | POA: Diagnosis not present

## 2022-04-24 DIAGNOSIS — M6281 Muscle weakness (generalized): Secondary | ICD-10-CM | POA: Diagnosis not present

## 2022-05-01 DIAGNOSIS — M75121 Complete rotator cuff tear or rupture of right shoulder, not specified as traumatic: Secondary | ICD-10-CM | POA: Diagnosis not present

## 2022-05-01 DIAGNOSIS — M6281 Muscle weakness (generalized): Secondary | ICD-10-CM | POA: Diagnosis not present

## 2022-05-01 DIAGNOSIS — M25611 Stiffness of right shoulder, not elsewhere classified: Secondary | ICD-10-CM | POA: Diagnosis not present

## 2022-05-08 DIAGNOSIS — M75121 Complete rotator cuff tear or rupture of right shoulder, not specified as traumatic: Secondary | ICD-10-CM | POA: Diagnosis not present

## 2022-05-08 DIAGNOSIS — M25611 Stiffness of right shoulder, not elsewhere classified: Secondary | ICD-10-CM | POA: Diagnosis not present

## 2022-05-08 DIAGNOSIS — M6281 Muscle weakness (generalized): Secondary | ICD-10-CM | POA: Diagnosis not present

## 2022-05-16 DIAGNOSIS — M25611 Stiffness of right shoulder, not elsewhere classified: Secondary | ICD-10-CM | POA: Diagnosis not present

## 2022-05-16 DIAGNOSIS — M6281 Muscle weakness (generalized): Secondary | ICD-10-CM | POA: Diagnosis not present

## 2022-05-16 DIAGNOSIS — M25562 Pain in left knee: Secondary | ICD-10-CM | POA: Diagnosis not present

## 2022-05-16 DIAGNOSIS — M75121 Complete rotator cuff tear or rupture of right shoulder, not specified as traumatic: Secondary | ICD-10-CM | POA: Diagnosis not present

## 2022-05-22 DIAGNOSIS — M6281 Muscle weakness (generalized): Secondary | ICD-10-CM | POA: Diagnosis not present

## 2022-05-22 DIAGNOSIS — M75121 Complete rotator cuff tear or rupture of right shoulder, not specified as traumatic: Secondary | ICD-10-CM | POA: Diagnosis not present

## 2022-05-22 DIAGNOSIS — M25611 Stiffness of right shoulder, not elsewhere classified: Secondary | ICD-10-CM | POA: Diagnosis not present

## 2022-05-23 DIAGNOSIS — M25562 Pain in left knee: Secondary | ICD-10-CM | POA: Diagnosis not present

## 2022-05-30 DIAGNOSIS — M6281 Muscle weakness (generalized): Secondary | ICD-10-CM | POA: Diagnosis not present

## 2022-05-30 DIAGNOSIS — M25611 Stiffness of right shoulder, not elsewhere classified: Secondary | ICD-10-CM | POA: Diagnosis not present

## 2022-05-30 DIAGNOSIS — M75121 Complete rotator cuff tear or rupture of right shoulder, not specified as traumatic: Secondary | ICD-10-CM | POA: Diagnosis not present

## 2022-06-02 DIAGNOSIS — R109 Unspecified abdominal pain: Secondary | ICD-10-CM | POA: Diagnosis not present

## 2022-06-02 DIAGNOSIS — R350 Frequency of micturition: Secondary | ICD-10-CM | POA: Diagnosis not present

## 2022-06-09 DIAGNOSIS — M25611 Stiffness of right shoulder, not elsewhere classified: Secondary | ICD-10-CM | POA: Diagnosis not present

## 2022-06-09 DIAGNOSIS — M6281 Muscle weakness (generalized): Secondary | ICD-10-CM | POA: Diagnosis not present

## 2022-06-09 DIAGNOSIS — M75121 Complete rotator cuff tear or rupture of right shoulder, not specified as traumatic: Secondary | ICD-10-CM | POA: Diagnosis not present

## 2022-06-12 ENCOUNTER — Encounter: Payer: Self-pay | Admitting: Cardiovascular Disease

## 2022-06-12 NOTE — Progress Notes (Signed)
CARDIOLOGY OFFICE NOTE  Date:  06/26/2022    Gina Morris Date of Birth: 1955/04/30 Medical Record #[id number]  PCP:  Gina Ng, NP  Cardiologist:  Gina Morris   History of Present Illness: Gina Morris is a 67 y.o. female has a history of CAD with NSTEMI 12/2017 - cath with single vessel DES to mid AV circumflex - normal EF, prior smoker, GERD, and HLD - on statin.   02/16/20 found to be in afib ate weight loss clinic Seen by Gina Morris in afib clinic 02/16/20. CHADVASC 4 Noted palpitations, fatigue and dizziness Was back in NSR during his visit Started on eliquis and continued on lopressor d/c ASA Referred for sleep study   Myovue done 03/03/20 normal no ischemia EF 62% TSH normal   Discussed getting back on 81 mg ASA  Discussed ok to use Plenity for weight loss  She wants some testing for RA- has pain/swelling in ankles and hands  07/28/20 LA negative ESR 33 ANA / RF negative   Needs shoulder surgery. 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   Fatty liver    GERD (gastroesophageal reflux disease)    Joint pain    Morbid obesity (HCC)    Myocardial infarction (HCC)    Smoking greater than 10 pack years 06/18/2012   Tobacco abuse     Past Surgical History:  Procedure Laterality Date   BREAST BIOPSY Left 01/25/2022   MM LT BREAST BX W LOC DEV 1ST LESION IMAGE BX SPEC STEREO GUIDE 01/25/2022  [facility]   BREAST BIOPSY Left 01/25/2022   MM LT BREAST BX W LOC DEV EA AD LESION IMG BX SPEC STEREO GUIDE 01/25/2022 [facility]   CORONARY STENT INTERVENTION N/A 12/24/2017   Procedure: CORONARY STENT INTERVENTION;  Surgeon: Gina Gess, MD;  Location: [facility];  Service: Cardiovascular;  Laterality: N/A;   CORONARY STENT PLACEMENT  12/24/2017   LEFT HEART CATH AND CORONARY ANGIOGRAPHY N/A 12/24/2017   Procedure: LEFT HEART CATH AND CORONARY ANGIOGRAPHY;  Surgeon: Gina Gess, MD;  Location: [facility];  Service: Cardiovascular;  Laterality: N/A;   TONSILLECTOMY       Medications: No outpatient medications have been marked as taking for the 06/26/22 encounter (Office Visit) with Gina Stade, MD.     Allergies: No Active Allergies   Social History: The patient  reports that she has quit smoking. Her smoking use included cigarettes. She smoked an average of .5 packs per day. She has never used smokeless tobacco. She reports current alcohol use of about 2.0 - 3.0 standard drinks of alcohol per week. She reports that she does not use drugs.   Family History: The patient's family history includes Alcohol abuse in her mother; Anxiety disorder in her mother; Cancer in her father; Depression in her mother; Diverticulitis  in her mother and sister; Healthy in her sister; Kidney disease in her mother; Pancreatic cancer in her father; Thyroid disease in her mother; Uterine cancer in her maternal grandmother.   Review of Systems: Please see the history of present illness.   All other systems are reviewed and negative.   Physical Exam: VS:  There were no vitals taken for this visit. Marland Kitchen  BMI There is no height or weight on file to calculate BMI.  Wt Readings from Last 3 Encounters:  06/23/22 264 lb 12.8 oz (120.1 kg)  06/06/21 273 lb (123.8 kg)  08/13/20 284 lb (128.8 kg)   Affect appropriate Obese  HEENT: normal Neck supple with no  adenopathy JVP normal no bruits no thyromegaly Lungs clear with no wheezing and good diaphragmatic motion Heart:  S1/S2 no murmur, no rub, gallop or click PMI normal Abdomen: benighn, BS positve, no tenderness, no AAA no bruit.  No HSM or HJR Distal pulses intact with no bruits LE varicosities and plus one bilateral edema Neuro non-focal Skin warm and dry No muscular weakness    LABORATORY DATA:  EKG:  SR LAE nonspecific ST changes 02/16/20   Lab Results  Component Value Date   WBC 7.5 06/06/2021   HGB 14.5 06/06/2021   HCT 41.9 06/06/2021   PLT 288 06/06/2021   GLUCOSE 108 (H) 06/06/2021   CHOL 163 02/16/2020   TRIG 79 02/16/2020   HDL 53 02/16/2020   LDLCALC 95 02/16/2020   ALT 22 08/13/2020   AST 18 08/13/2020   NA 140 06/06/2021   K 4.4 06/06/2021   CL 103 06/06/2021   CREATININE 0.87 06/06/2021   BUN 13 06/06/2021   CO2 24 06/06/2021   TSH 2.170 02/16/2020   INR 0.99 12/24/2017   HGBA1C 5.6 02/16/2020     BNP (last 3 results) No results for input(s): "BNP" in the last 8760 hours.  ProBNP (last 3 results) No results for input(s): "PROBNP" in the last 8760 hours.   Other Studies Reviewed Today:  CORONARY STENT INTERVENTION 12/2017  LEFT HEART CATH AND CORONARY ANGIOGRAPHY   Conclusion    Prox Cx to Mid Cx lesion is 90% stenosed. Ost LAD to Prox LAD lesion is 30% stenosed. Prox LAD to Mid LAD lesion is 30% stenosed. A drug-eluting stent was successfully placed. Post intervention, there is a 0% residual stenosis.    Echo Study Conclusions 12/2017  - Left ventricle: The cavity size was normal. Wall thickness was    increased in a pattern of mild LVH. Systolic function was    vigorous. The estimated ejection fraction was in the range of 65%    to 70%. Wall motion was normal; there were no regional wall    motion abnormalities. Doppler parameters are consistent with    abnormal left ventricular relaxation (grade 1 diastolic    dysfunction). The  E/e&' ratio is <8, suggesting normal LV filling    pressure.  - Mitral valve: Mildly thickened leaflets . There was mild    regurgitation.  - Left atrium: The atrium was mildly dilated.  - Right atrium: The atrium was mildly dilated.  - Tricuspid valve: There was mild regurgitation.  - Pulmonary arteries: PA peak pressure: 27 mm Hg (S).  - Inferior vena cava: The vessel was normal in size. The    respirophasic diameter changes were in the normal range (>= 50%),    consistent with normal central venous pressure.   Impressions:   - LVEF 65-70%, mild LVH, normal  wall motion, grade 1 DD, normal LV    filling pressure, mild MR, mild biatrial enlargement, mild TR,    RVSP 27 mmHg, normal IVC.    ASSESSMENT AND PLAN:   1. New onset AF - back in NSR - sleep study ordered continue eliquis and lopressor F/U Dr Gina Morris for ablation PV CTA ordered   2. CAD - prior MI with DES to [facility] for single vessel disease - 81 mg ASA and  Eliquis per the AF clinic -Myovue normal 03/03/20 [id number] 3. HTN - Well controlled.  Continue current medications and low sodium Dash type diet.    4. HLD - LDL 95 continue high dose lipitor   5. Obesity - now in the Weight Loss clinic - Ok to use Plenity supplement   6. Concern for OSA - sleep study ordered by AF clinic 02/16/20  pending still   7. Prior smoker - not smoking. CT 06/15/21 < 5 mm stable nodules needs updating  8. Arthritis:  ?  F/u primary auto immune labs benign   Time: spent reviewing chart myovue, cath, echo direct patient interview and composing note 20 minutes   Current medicines are reviewed with the patient today.  The patient does not have concerns regarding medicines other than what has been noted above.  The following changes have been made:  See above.  Labs/ tests ordered today include:   Lung cancer CT    Lung cancer CT/nodules    Disposition:   FU in a year F/U EP next available    Patient is agreeable to this plan and will call  if any problems develop in the interim.   Signed: Charlton Haws, MD  06/26/2022 8:03 AM  [facility] [address] [place], [place]  [address] Phone: [phone] Fax: [phone]

## 2022-06-12 NOTE — Telephone Encounter (Signed)
Error

## 2022-06-14 DIAGNOSIS — M75121 Complete rotator cuff tear or rupture of right shoulder, not specified as traumatic: Secondary | ICD-10-CM | POA: Diagnosis not present

## 2022-06-14 DIAGNOSIS — M25611 Stiffness of right shoulder, not elsewhere classified: Secondary | ICD-10-CM | POA: Diagnosis not present

## 2022-06-14 DIAGNOSIS — M6281 Muscle weakness (generalized): Secondary | ICD-10-CM | POA: Diagnosis not present

## 2022-06-20 DIAGNOSIS — L718 Other rosacea: Secondary | ICD-10-CM | POA: Diagnosis not present

## 2022-06-20 DIAGNOSIS — D225 Melanocytic nevi of trunk: Secondary | ICD-10-CM | POA: Diagnosis not present

## 2022-06-20 DIAGNOSIS — L811 Chloasma: Secondary | ICD-10-CM | POA: Diagnosis not present

## 2022-06-20 DIAGNOSIS — L564 Polymorphous light eruption: Secondary | ICD-10-CM | POA: Diagnosis not present

## 2022-06-22 NOTE — Progress Notes (Signed)
Electrophysiology Office Note:    Date:  06/23/2022   ID:  Gina Morris, DOB May 10, 1955, MRN [id number]  [facility] Cardiologist:  Charlton Haws, MD  [facility] Electrophysiologist:  Lanier Prude, MD   Referring MD: Anne Ng, NP   Chief Complaint: Atrial fibrillation  History of Present Illness:    Gina Morris is a 67 y.o. female who I am seeing today for an evaluation of atrial fibrillation at the request of Dr. Eden Emms.  The patient last saw Dr. Eden Emms June 06, 2021.  The patient has a history of coronary artery disease, prior tobacco use, GERD and hyperlipidemia.  Referral to electrophysiology was discussed at the last appointment in June 2023.  She is symptomatic with atrial fibrillation.  She feels palpitations.  She takes Eliquis without any bleeding issues.  The episodes have become more frequent since her stenting.  Alcohol seems to be a trigger.  Exercise actually helps improve her symptoms typically.  Deep breathing also help slow down her rhythm.  She was in normal rhythm as recently as yesterday.  Today she feels that she is back out of rhythm which is confirmed on EKG.      Their past medical, social and family history was reveiwed.   ROS:   Please see the history of present illness.    All other systems reviewed and are negative.  EKGs/Labs/Other Studies Reviewed:    The following studies were reviewed today:  March 2022 SPECT low risk study  December 2019 heart catheterization 90% circumflex successfully stented  December 2019 echo  EF 65 - 70, mild MR     EKG Interpretation  Date/Time:  Friday June 23 2022 09:06:15 EDT Ventricular Rate:  87 PR Interval:    QRS Duration: 64 QT Interval:  346 QTC Calculation: 416 R Axis:   20 Text Interpretation: Atrial fibrillation Confirmed by Steffanie Dunn [id number]) on 06/23/2022 9:33:20 AM     Physical Exam:    VS:  BP 100/74   Pulse 92   Ht 5\' 5"  (1.651 m)   Wt 264 lb 12.8  oz (120.1 kg)   SpO2 94%   BMI 44.07 kg/m     Wt Readings from Last 3 Encounters:  06/23/22 264 lb 12.8 oz (120.1 kg)  06/06/21 273 lb (123.8 kg)  08/13/20 284 lb (128.8 kg)     GEN:  Well nourished, well developed in no acute distress CARDIAC: Irregularly irregular, no murmurs, rubs, gallops RESPIRATORY:  Clear to auscultation without rales, wheezing or rhonchi       ASSESSMENT AND PLAN:    1. PAF (paroxysmal atrial fibrillation) (HCC)   2. Primary hypertension     # Persistent atrial fibrillation Discussed treatment options for her persistent atrial fibrillation during today's clinic appointment.  Discussed catheter ablation and antiarrhythmic drugs.  Currently takes Eliquis for stroke prophylaxis.  Today we discussed conservative medical therapy with stroke prophylaxis only, rhythm control using antiarrhythmic drugs and rhythm control using catheter ablation.  Given her coronary artery disease her antiarrhythmic drug options are limited.  We discussed amiodarone and dofetilide in detail.  I discussed the risks of each option.  She plans to talk to Dr. Eden Emms next week at her appointment about everything.  She is planning on pursuing catheter ablation and wants to hold a date but will talk to Dr. Eden Emms to confirm the plan which is very understandable.  Discussed treatment options today for AF including antiarrhythmic drug therapy and ablation. Discussed risks, recovery  and likelihood of success with each treatment strategy. Risk, benefits, and alternatives to EP study and radiofrequency ablation for afib were discussed. These risks include but are not limited to stroke, bleeding, vascular damage, tamponade, perforation, damage to the esophagus, lungs, phrenic nerve and other structures, pulmonary vein stenosis, worsening renal function, and death.  Discussed potential need for repeat ablation procedures and antiarrhythmic drugs after an initial ablation. The patient understands these  risk and wishes to proceed.  We will therefore proceed with catheter ablation at the next available time.  Carto, ICE, anesthesia are requested for the procedure.  Will also obtain CT PV protocol prior to the procedure to exclude LAA thrombus and further evaluate atrial anatomy.   #Hypertension At goal today.  Recommend checking blood pressures 1-2 times per week at home and recording the values.  Recommend bringing these recordings to the primary care physician.         Signed, Rossie Muskrat. Lalla Brothers, MD, [facility], [facility] 06/23/2022 10:01 AM    Electrophysiology [facility]

## 2022-06-23 ENCOUNTER — Encounter: Payer: Self-pay | Admitting: Cardiology

## 2022-06-23 ENCOUNTER — Ambulatory Visit: Payer: BC Managed Care – PPO | Attending: Cardiology | Admitting: Cardiology

## 2022-06-23 VITALS — BP 100/74 | HR 92 | Ht 65.0 in | Wt 264.8 lb

## 2022-06-23 DIAGNOSIS — M75121 Complete rotator cuff tear or rupture of right shoulder, not specified as traumatic: Secondary | ICD-10-CM | POA: Diagnosis not present

## 2022-06-23 DIAGNOSIS — M25611 Stiffness of right shoulder, not elsewhere classified: Secondary | ICD-10-CM | POA: Diagnosis not present

## 2022-06-23 DIAGNOSIS — I480 Paroxysmal atrial fibrillation: Secondary | ICD-10-CM

## 2022-06-23 DIAGNOSIS — M6281 Muscle weakness (generalized): Secondary | ICD-10-CM | POA: Diagnosis not present

## 2022-06-23 DIAGNOSIS — I10 Essential (primary) hypertension: Secondary | ICD-10-CM

## 2022-06-23 NOTE — Patient Instructions (Addendum)
Medication Instructions:  Your physician recommends that you continue on your current medications as directed. Please refer to the Current Medication list given to you today.  *If you need a refill on your cardiac medications before your next appointment, please call your pharmacy*  Lab Work: BMET and CBC prior to CT scan and ablation  Testing/Procedures: Your physician has requested that you have cardiac CT. Cardiac computed tomography (CT) is a painless test that uses an x-ray machine to take clear, detailed pictures of your heart. For further information please visit [email or web address] We will call you to schedule your CT scan. This will be done about one week prior to your ablation.   Your physician has recommended that you have an ablation. Catheter ablation is a medical procedure used to treat some cardiac arrhythmias (irregular heartbeats). During catheter ablation, a long, thin, flexible tube is put into a blood vessel in your groin (upper thigh), or neck. This tube is called an ablation catheter. It is then guided to your heart through the blood vessel. Radio frequency waves destroy small areas of heart tissue where abnormal heartbeats may cause an arrhythmia to start. You are scheduled for Atrial Fibrillation Ablation on Friday, November 1 with Dr. Steffanie Dunn.Please arrive at the Main Entrance A at [facility]: [address] [place], [place] [address] at 8:30 AM   Follow-Up: At [facility], you and your health needs are our priority.  As part of our continuing mission to provide you with exceptional heart care, we have created designated Provider Care Teams.  These Care Teams include your primary Cardiologist (physician) and Advanced Practice Providers (APPs -  Physician Assistants and Nurse Practitioners) who all work together to provide you with the care you need, when you need it.  Your next appointment:   We will call you to schedule your follow up  appointments

## 2022-06-23 NOTE — Addendum Note (Signed)
Addended by: Frutoso Schatz on: 06/23/2022 10:07 AM   Modules accepted: Orders

## 2022-06-26 ENCOUNTER — Ambulatory Visit: Payer: BC Managed Care – PPO | Attending: Cardiovascular Disease | Admitting: Cardiovascular Disease

## 2022-06-26 VITALS — BP 120/70 | Ht 65.0 in | Wt 267.0 lb

## 2022-06-26 DIAGNOSIS — R918 Other nonspecific abnormal finding of lung field: Secondary | ICD-10-CM | POA: Diagnosis not present

## 2022-06-26 DIAGNOSIS — I480 Paroxysmal atrial fibrillation: Secondary | ICD-10-CM | POA: Diagnosis not present

## 2022-06-26 MED ORDER — NITROGLYCERIN 0.4 MG SL SUBL: 0.4000 mg | SUBLINGUAL_TABLET | SUBLINGUAL | 3 refills | Status: AC | PRN

## 2022-06-26 MED ORDER — NITROGLYCERIN 0.4 MG SL SUBL: 0.4000 mg | SUBLINGUAL_TABLET | SUBLINGUAL | 3 refills | Status: DC | PRN

## 2022-06-26 NOTE — Patient Instructions (Addendum)
Medication Instructions:  Your physician recommends that you continue on your current medications as directed. Please refer to the Current Medication list given to you today.  *If you need a refill on your cardiac medications before your next appointment, please call your pharmacy*  Lab Work: If you have labs (blood work) drawn today and your tests are completely normal, you will receive your results only by: MyChart Message (if you have MyChart) OR A paper copy in the mail If you have any lab test that is abnormal or we need to change your treatment, we will call you to review the results.  Testing/Procedures: Non-Cardiac CT scanning, (CAT scanning), is a noninvasive, special x-ray that produces cross-sectional images of the body using x-rays and a computer. CT scans help physicians diagnose and treat medical conditions. For some CT exams, a contrast material is used to enhance visibility in the area of the body being studied. CT scans provide greater clarity and reveal more details than regular x-ray exams.   Follow-Up: At [facility], you and your health needs are our priority.  As part of our continuing mission to provide you with exceptional heart care, we have created designated Provider Care Teams.  These Care Teams include your primary Cardiologist (physician) and Advanced Practice Providers (APPs -  Physician Assistants and Nurse Practitioners) who all work together to provide you with the care you need, when you need it.  We recommend signing up for the patient portal called "MyChart".  Sign up information is provided on this After Visit Summary.  MyChart is used to connect with patients for Virtual Visits (Telemedicine).  Patients are able to view lab/test results, encounter notes, upcoming appointments, etc.  Non-urgent messages can be sent to your provider as well.   To learn more about what you can do with MyChart, go to [email or web address].    Your next appointment:    1 year(s)  Provider:   Peter Nishan, MD      

## 2022-06-26 NOTE — Addendum Note (Signed)
Addended by: Virl Axe, Chuk[place] Straus L on: 06/26/2022 08:23 AM   Modules accepted: Orders

## 2022-06-26 NOTE — Addendum Note (Signed)
Addended by: Virl Axe, Helaine Yackel L on: 06/26/2022 08:38 AM   Modules accepted: Orders

## 2022-06-30 DIAGNOSIS — M75121 Complete rotator cuff tear or rupture of right shoulder, not specified as traumatic: Secondary | ICD-10-CM | POA: Diagnosis not present

## 2022-07-07 DIAGNOSIS — M25611 Stiffness of right shoulder, not elsewhere classified: Secondary | ICD-10-CM | POA: Diagnosis not present

## 2022-07-07 DIAGNOSIS — M75121 Complete rotator cuff tear or rupture of right shoulder, not specified as traumatic: Secondary | ICD-10-CM | POA: Diagnosis not present

## 2022-07-07 DIAGNOSIS — M6281 Muscle weakness (generalized): Secondary | ICD-10-CM | POA: Diagnosis not present

## 2022-07-14 ENCOUNTER — Ambulatory Visit (HOSPITAL_COMMUNITY)
Admission: RE | Admit: 2022-07-14 | Discharge: 2022-07-14 | Disposition: A | Discharge status: Home / Self Care | Payer: BC Managed Care – PPO | Source: Ambulatory Visit | Attending: Cardiovascular Disease | Admitting: Cardiovascular Disease

## 2022-07-14 DIAGNOSIS — I480 Paroxysmal atrial fibrillation: Secondary | ICD-10-CM | POA: Diagnosis not present

## 2022-07-14 DIAGNOSIS — I700 Atherosclerosis of aorta: Secondary | ICD-10-CM | POA: Diagnosis not present

## 2022-07-14 DIAGNOSIS — J432 Centrilobular emphysema: Secondary | ICD-10-CM | POA: Diagnosis not present

## 2022-07-14 DIAGNOSIS — R918 Other nonspecific abnormal finding of lung field: Secondary | ICD-10-CM | POA: Diagnosis not present

## 2022-07-14 MED ORDER — FENTANYL CITRATE (PF) 100 MCG/2ML IJ SOLN
INTRAMUSCULAR | Status: AC
  Filled 2022-07-14: qty 2

## 2022-07-20 ENCOUNTER — Telehealth: Payer: Self-pay | Admitting: Cardiovascular Disease

## 2022-07-20 DIAGNOSIS — M25561 Pain in right knee: Secondary | ICD-10-CM | POA: Diagnosis not present

## 2022-07-20 NOTE — Telephone Encounter (Signed)
Returned call to patient and discussed CT results.  Per Dr. Eden Emms: Stable nodules no lung cancer good   Patient verbalized understanding and expressed appreciation for call.

## 2022-07-20 NOTE — Telephone Encounter (Signed)
Patient returned RN's call and has questions regarding results.

## 2022-10-25 ENCOUNTER — Other Ambulatory Visit (HOSPITAL_COMMUNITY): Payer: BC Managed Care – PPO

## 2022-11-03 ENCOUNTER — Ambulatory Visit (HOSPITAL_COMMUNITY): Admit: 2022-11-03 | Payer: BC Managed Care – PPO | Admitting: Cardiology

## 2022-11-03 ENCOUNTER — Encounter (HOSPITAL_COMMUNITY): Payer: Self-pay

## 2022-11-03 SURGERY — ATRIAL FIBRILLATION ABLATION
Anesthesia: General

## 2022-11-21 IMAGING — CT CT CHEST LUNG CANCER SCREENING LOW DOSE W/O CM
2 of 4 series · 15 of 36 positions shown, 18 images · non-contrast
Comparison: Standard CT chest 12/23/2013

CLINICAL DATA: 66-year-old female with 25 pack-year history of
smoking. Lung cancer screening.



[Series 3: lung thins 1.0 · axial · 0.69mm/px · z∈[-419,-132]mm · 12 of 317 slices shown, 15 images]
[im 15/317  mediastinal]
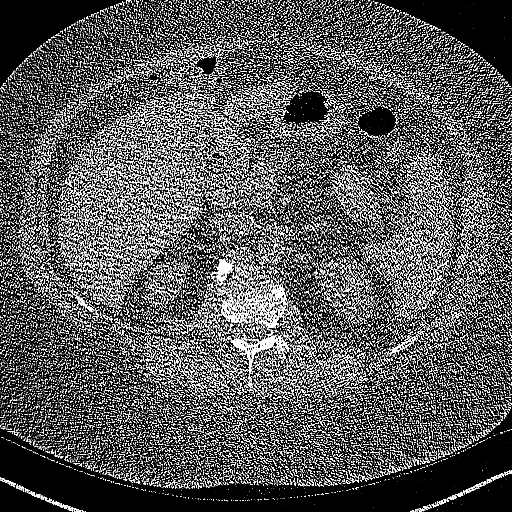
[im 15/317  lung]
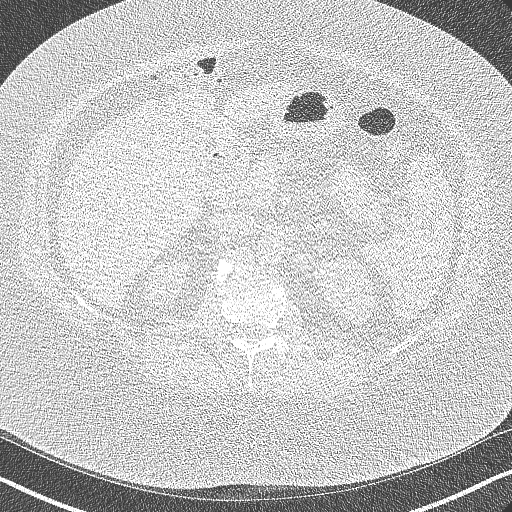
[im 44/317  lung]
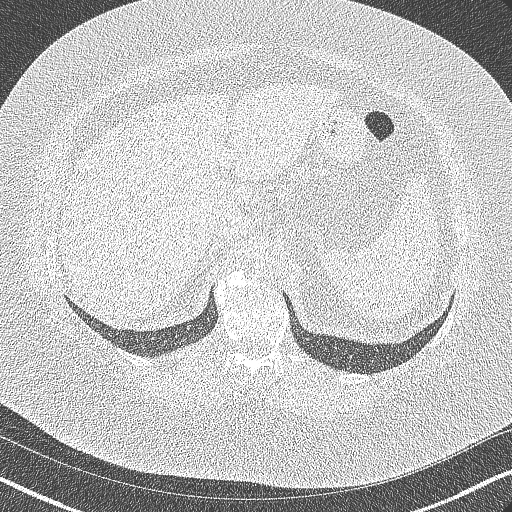
[im 72/317  lung]
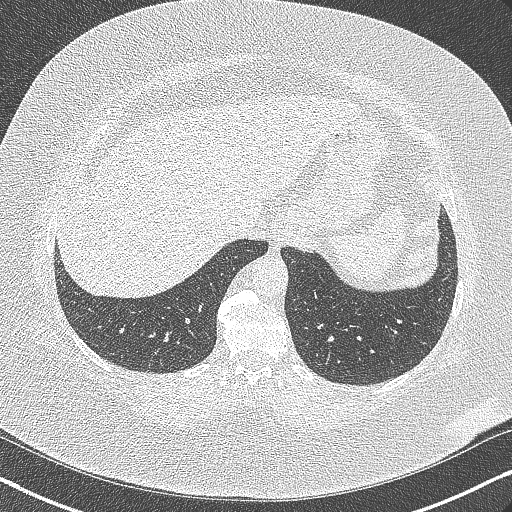
[im 101/317  lung]
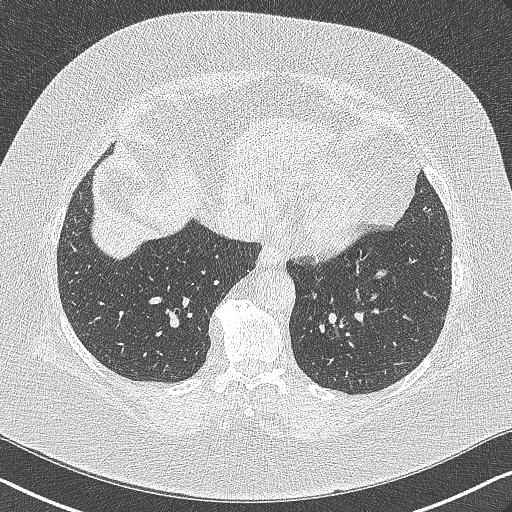
[im 115/317  mediastinal]
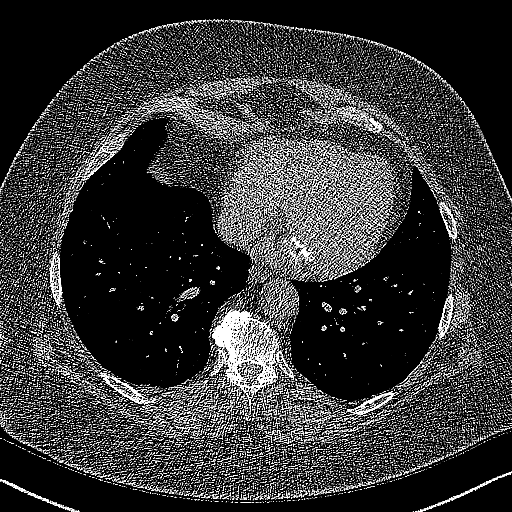
[im 115/317  lung]
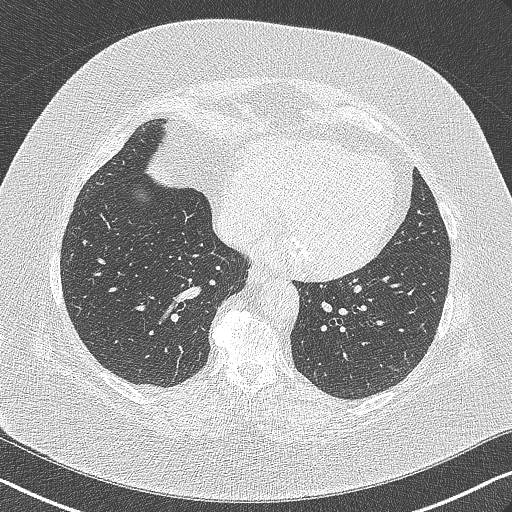
[im 144/317  lung]
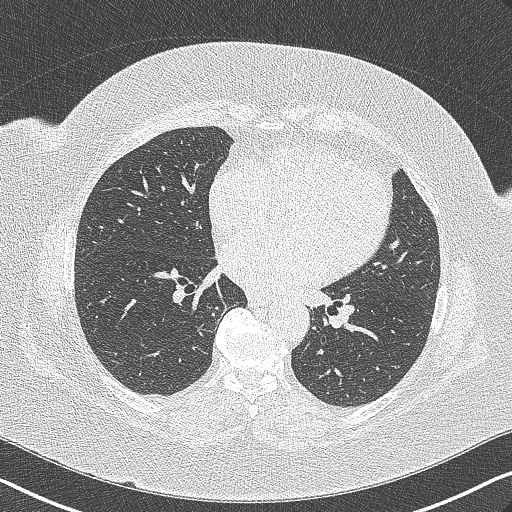
[im 173/317  lung]
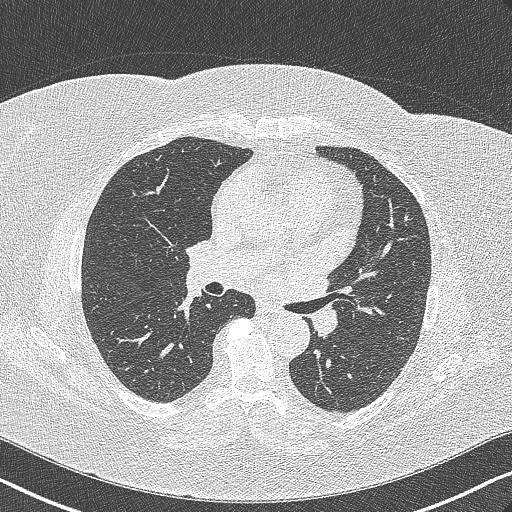
[im 202/317  lung]
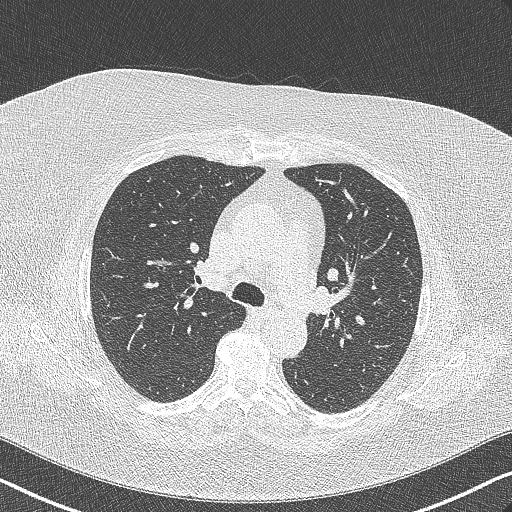
[im 216/317  mediastinal]
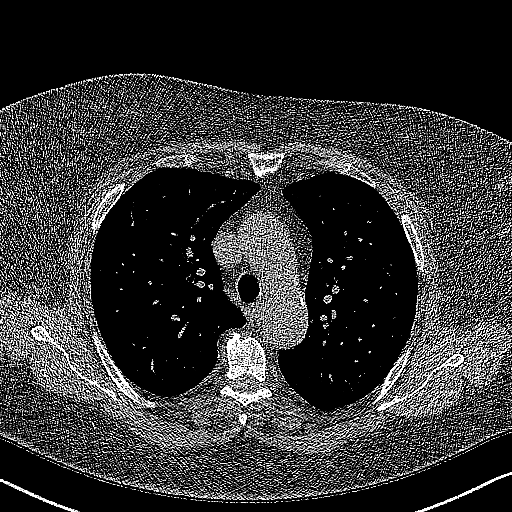
[im 216/317  lung]
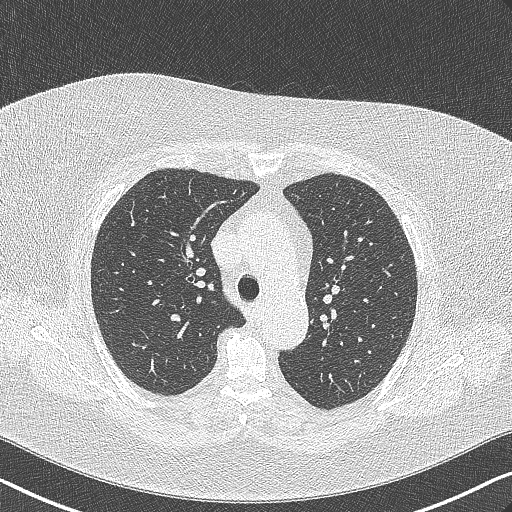
[im 245/317  lung]
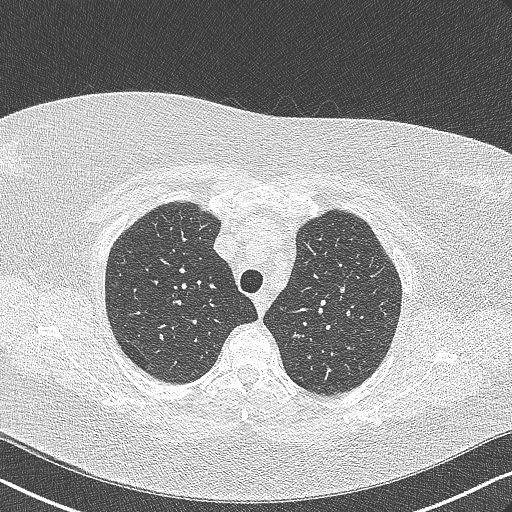
[im 273/317  lung]
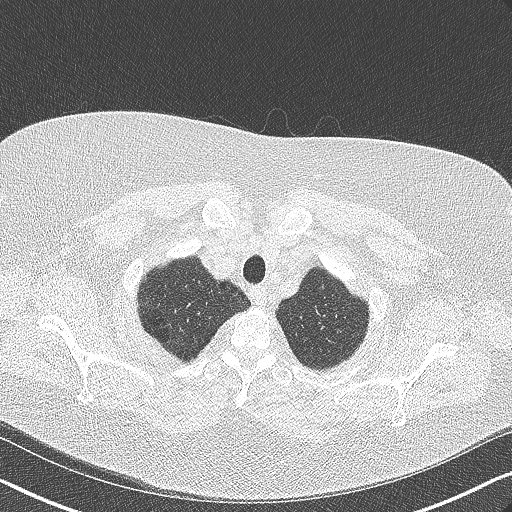
[im 302/317  lung]
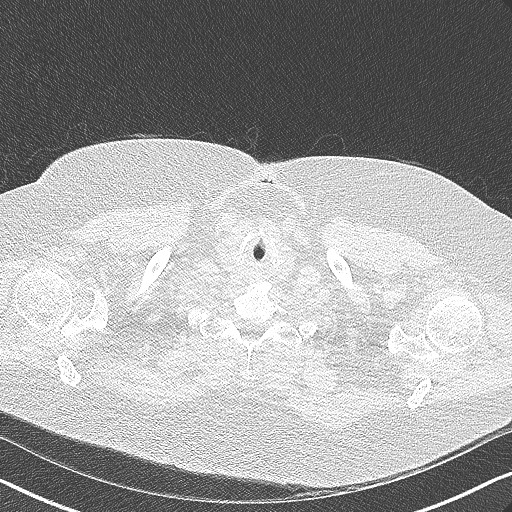

[Series 5: coronal · coronal · 0.62mm/px · 3 of 129 slices shown]
[im 26/129  lung]
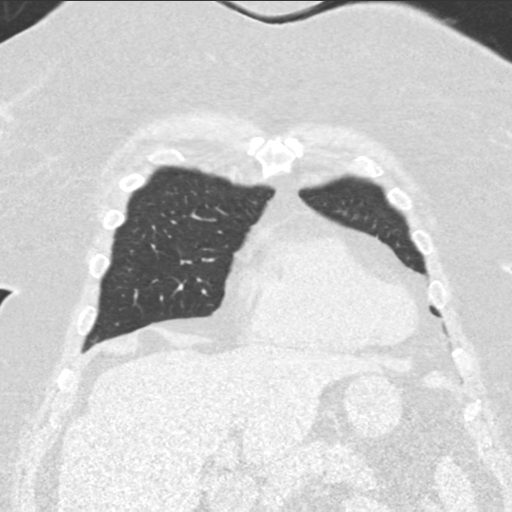
[im 52/129  lung]
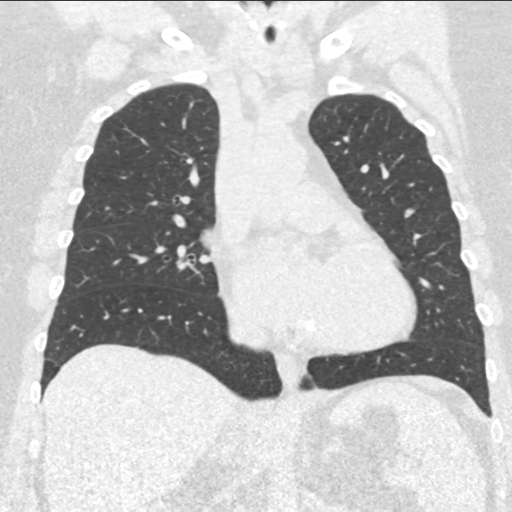
[im 77/129  lung]
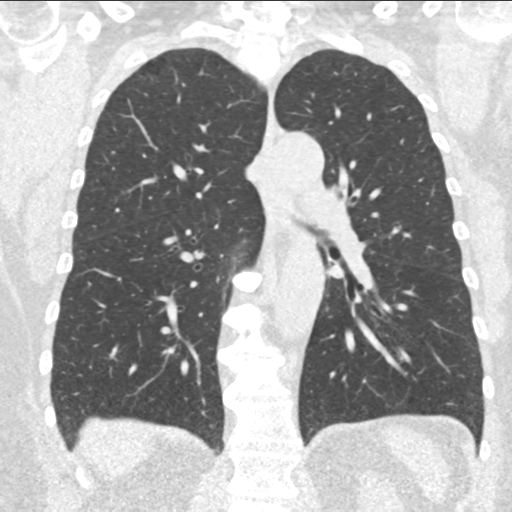

[15 of 36 positions shown; findings below may reference images not displayed]

FINDINGS: Cardiovascular: The heart size is normal. No substantial pericardial
effusion. Coronary artery calcification is evident. Mitral annular
calcification evident. Mild atherosclerotic calcification is noted
in the wall of the thoracic aorta.

Mediastinum/Nodes: No mediastinal lymphadenopathy. No evidence for
gross hilar lymphadenopathy although assessment is limited by the
lack of intravenous contrast on the current study. The esophagus has
normal imaging features. There is no axillary lymphadenopathy.

Lungs/Pleura: Centrilobular and paraseptal emphysema evident.
Scattered bilateral pulmonary nodules are identified measuring up to
maximum diameter 5 mm. No suspicious pulmonary nodule or mass. No
focal airspace consolidation. No pleural effusion.

Upper Abdomen: Unremarkable.

Musculoskeletal: No worrisome lytic or sclerotic osseous
abnormality.
IMPRESSION: 1. Lung-RADS 2, benign appearance or behavior. Continue annual
screening with low-dose chest CT without contrast in 12 months.
2. Aortic Atherosclerosis (FR3MW-2M9.9) and Emphysema (FR3MW-OMU.O).

## 2022-11-24 ENCOUNTER — Encounter: Payer: Self-pay | Admitting: Cardiology

## 2022-11-29 ENCOUNTER — Telehealth: Payer: Self-pay

## 2022-11-29 ENCOUNTER — Encounter: Payer: Self-pay | Admitting: Cardiology

## 2022-11-29 ENCOUNTER — Ambulatory Visit: Payer: BC Managed Care – PPO | Attending: Cardiology | Admitting: Cardiology

## 2022-11-29 VITALS — Ht 65.0 in | Wt 260.0 lb

## 2022-11-29 DIAGNOSIS — I480 Paroxysmal atrial fibrillation: Secondary | ICD-10-CM | POA: Diagnosis not present

## 2022-11-29 DIAGNOSIS — I10 Essential (primary) hypertension: Secondary | ICD-10-CM

## 2022-11-29 NOTE — Patient Instructions (Signed)
Medication Instructions:  Your physician recommends that you continue on your current medications as directed. Please refer to the Current Medication list given to you today.  *If you need a refill on your cardiac medications before your next appointment, please call your pharmacy*  Tests/Procedures: Your physician has recommended that you have an ablation. Catheter ablation is a medical procedure used to treat some cardiac arrhythmias (irregular heartbeats). During catheter ablation, a long, thin, flexible tube is put into a blood vessel in your groin (upper thigh), or neck. This tube is called an ablation catheter. It is then guided to your heart through the blood vessel. Radio frequency waves destroy small areas of heart tissue where abnormal heartbeats may cause an arrhythmia to start. Please see the instruction sheet given to you today.  Your physician has requested that you have Left atrial appendage (LAA) closure device implantation is a procedure to put a small device in the LAA of the heart. The LAA is a small sac in the wall of the heart's left upper chamber. Blood clots can form in this area. The device, Watchman closes the LAA to help prevent a blood clot and stroke.   Follow-Up: At [facility], you and your health needs are our priority.  As part of our continuing mission to provide you with exceptional heart care, we have created designated Provider Care Teams.  These Care Teams include your primary Cardiologist (physician) and Advanced Practice Providers (APPs -  Physician Assistants and Nurse Practitioners) who all work together to provide you with the care you need, when you need it.  Call us when you are ready to schedule your procedures.

## 2022-11-29 NOTE — Telephone Encounter (Signed)
Called and spoke with Pt's daughter according to [facility] is her point of contact. Maralyn Sago stated pt must have forgotten about her appointment with Dr.Lambert today and is currently at work. Maralyn Sago will contact pt to see if she is able to complete Telehealth appointment or if pt will need to be rescheduled.Patient does not have a way of taking vitals unfortunately since she is at work.

## 2022-11-29 NOTE — Progress Notes (Signed)
Virtual Visit via Video Note   Because of Gina Morris's co-morbid illnesses, she is at least at moderate risk for complications without adequate follow up.  This format is felt to be most appropriate for this patient at this time.  All issues noted in this document were discussed and addressed.  A limited physical exam was performed with this format.  Please refer to the patient's chart for her consent to telehealth for [facility].       Date:  11/29/2022   ID:  Gina Morris, DOB 06/18/1955, MRN [id number] The patient was identified using 2 identifiers.  Patient Location: Home Provider Location: Office/Clinic   PCP:  Anne Ng, NP   [facility] Providers Cardiologist:  Charlton Haws, MD Electrophysiologist:  Lanier Prude, MD     Evaluation Performed:  Follow-Up Visit  Chief Complaint:  AF  History of Present Illness:    Gina Morris is a 67 y.o. female who I am seeing today in follow-up.  I last saw the patient June 23, 2022 for her atrial fibrillation.  She is on Eliquis for stroke prophylaxis.  At her last appointment we discussed catheter ablation for her atrial fibrillation.  She originally scheduled a procedure but then canceled.  She has severe coronary artery disease with prior PCI.  She continues to have symptomatic episodes of atrial fibrillation.  She has been doing a lot of research on ablation and is interested in possibly proceeding with pulsed field catheter ablation.  She is also interested in avoiding long-term exposure anticoagulation given the elevated risk with prolonged exposure.  She is also not interested in taking a medication like that lifelong.  She brings up watchman during today's visit.   Past medical, surgical, family and social history reviewed.     ROS:   Please see the history of present illness.    All other systems reviewed and are negative.       Objective:    Vital Signs:  Ht 5\' 5"   (1.651 m)   Wt 260 lb (117.9 kg)   BMI 43.27 kg/m    ASSESSMENT & PLAN:    #Atrial fibrillation Symptomatic episodes.  I have previously discussed management options for her atrial fibrillation including antiarrhythmic drugs and catheter ablation.  Her history of coronary artery disease prevent use of class Ic agents.  We could consider amiodarone or Tikosyn.  Her EKG and QTc appear acceptable for loading with Tikosyn.  We also discussed catheter ablation with pulse field technology during today's visit.  She is very interested in this and will likely call in to schedule.  Discussed treatment options today for AF including antiarrhythmic drug therapy and ablation. Discussed risks, recovery and likelihood of success with each treatment strategy. Risk, benefits, and alternatives to EP study and ablation for afib were discussed. These risks include but are not limited to stroke, bleeding, vascular damage, tamponade, perforation, damage to the esophagus, lungs, phrenic nerve and other structures, pulmonary vein stenosis, worsening renal function, coronary vasospasm and death.  Discussed potential need for repeat ablation procedures and antiarrhythmic drugs after an initial ablation. The patient understands these risks.  We will therefore proceed with catheter ablation at the next available time.  Carto, ICE, anesthesia are requested for the procedure.  Will also obtain CT PV protocol prior to the procedure to exclude LAA thrombus and further evaluate atrial anatomy.  ------------------  I have seen Gina Morris in the office today who is  being considered for a Watchman left atrial appendage closure device. I believe they will benefit from this procedure given their history of atrial fibrillation, CHA2DS2-VASc score of 4. Unfortunately, the patient is not felt to be a long term anticoagulation candidate secondary to a strong preference to avoid indefinite OAC exposure. The patient's chart has been  reviewed and I feel that they would be a candidate for short term oral anticoagulation after Watchman implant.   It is my belief that after undergoing a LAA closure procedure, Gina Morris will not need long term anticoagulation which eliminates anticoagulation side effects and major bleeding risk.   Procedural risks for the Watchman implant have been reviewed with the patient including a 0.5% risk of stroke, <1% risk of perforation and <1% risk of device embolization. Other risks include bleeding, vascular damage, tamponade, worsening renal function, and death.    The published clinical data on the safety and effectiveness of WATCHMAN include but are not limited to the following: - Holmes DR, Everlene Farrier, Sick P et al. for the PROTECT AF Investigators. Percutaneous closure of the left atrial appendage versus warfarin therapy for prevention of stroke in patients with atrial fibrillation: a randomised non-inferiority trial. Lancet 2009; 374: 534-42. Everlene Farrier, Doshi SK, Isa Rankin D et al. on behalf of the PROTECT AF Investigators. Percutaneous Left Atrial Appendage Closure for Stroke Prophylaxis in Patients With Atrial Fibrillation 2.3-Year Follow-up of the PROTECT AF (Watchman Left Atrial Appendage System for Embolic Protection in Patients With Atrial Fibrillation) Trial. Circulation 2013; 127:720-729. - Alli O, Doshi S,  Kar S, Reddy VY, Sievert H et al. Quality of Life Assessment in the Randomized PROTECT AF (Percutaneous Closure of the Left Atrial Appendage Versus Warfarin Therapy for Prevention of Stroke in Patients With Atrial Fibrillation) Trial of Patients at Risk for Stroke With Nonvalvular Atrial Fibrillation. J Am Coll Cardiol 2013; 61:1790-8. Aline August DR, Mia Creek, Price M, Whisenant B, Sievert H, Doshi S, Huber K, Reddy V. Prospective randomized evaluation of the Watchman left atrial appendage Device in patients with atrial fibrillation versus long-term warfarin therapy; the PREVAIL  trial. Journal of the [employer] of Cardiology, Vol. 4, No. 1, 2014, 1-11. - Kar S, Doshi SK, Sadhu A, Horton R, Osorio J et al. Primary outcome evaluation of a next-generation left atrial appendage closure device: results from the PINNACLE FLX trial. Circulation 2021;143(18)1754-1762.     HAS-BLED score 2 Hypertension Yes  Abnormal renal and liver function (Dialysis, transplant, Cr >2.26 mg/dL /Cirrhosis or Bilirubin >2x Normal or AST/ALT/AP >3x Normal) No  Stroke No  Bleeding No  Labile INR (Unstable/high INR) No  Elderly (>65) Yes  Drugs or alcohol (>= 8 drinks/week, anti-plt or NSAID) No   CHA2DS2-VASc Score = 4  The patient's score is based upon: CHF History: 0 HTN History: 1 Diabetes History: 0 Stroke History: 0 Vascular Disease History: 1 Age Score: 1 Gender Score: 1   The patient will think about her options and let us know if she would like to proceed with scheduling.  Time:   Today, I have spent 20 minutes with the patient with telehealth technology discussing the above problems.     Signed, Lanier Prude, MD  11/29/2022 4:01 PM    [facility]

## 2023-04-27 ENCOUNTER — Other Ambulatory Visit: Payer: Self-pay

## 2023-04-27 ENCOUNTER — Encounter: Payer: Self-pay | Admitting: Cardiovascular Disease

## 2023-04-27 MED ORDER — APIXABAN 5 MG PO TABS: 5.0000 mg | ORAL_TABLET | Freq: Two times a day (BID) | ORAL | 3 refills | Status: AC

## 2023-04-27 NOTE — Telephone Encounter (Signed)
 Prescription refill request for Eliquis  received. Indication:afib Last office visit:11/24 Scr:0.88  5/24 Age: 68 Weight:117.9  kg  Prescription refilled

## 2023-04-30 MED ORDER — ATORVASTATIN CALCIUM 80 MG PO TABS: 80.0000 mg | ORAL_TABLET | Freq: Every day | ORAL | 0 refills | Status: DC

## 2023-04-30 MED ORDER — METOPROLOL TARTRATE 25 MG PO TABS: 25.0000 mg | ORAL_TABLET | Freq: Two times a day (BID) | ORAL | 0 refills | Status: DC

## 2023-06-13 NOTE — Progress Notes (Signed)
 CARDIOLOGY OFFICE NOTE  Date:  06/22/2023    [name] Date of Birth: Jan 13, 1955 Medical Record #[id number]  PCP:  Patient, No Pcp Per  Cardiologist:  Stann Earnest   History of Present Illness: Gina Morris is a 68 y.o. female has a history of CAD with NSTEMI 12/2017 - cath with single vessel DES to mid AV circumflex - normal EF, prior smoker, GERD, and HLD - on statin.   02/16/20 found to be in afib ate weight loss clinic Seen by Fenton in afib clinic 02/16/20. CHADVASC 4 Noted palpitations, fatigue and dizziness Was back in NSR during his visit Started on eliquis  and continued on lopressor  d/c ASA Referred for sleep study   Myovue done 03/03/20 normal no ischemia EF 62% TSH normal   Discussed getting back on 81 mg ASA  Discussed ok to use Plenity for weight loss  She wants some testing for RA- has pain/swelling in ankles and hands  07/28/20 LA negative ESR 33 ANA / RF negative   Needs shoulder surgery. Has LE varicosities with edema Had left breast biopsy negative for cancer 01/27/22   Seen by Marven Slimmer 06/23/22 He discussed Tikosyn/amiiodarone vs ablation Shared decision making favor latter as she is relatively young and AAT not very effective with limited choices given CAD I believe she is going to have ablation latter this month   Discussed having lung cancer CT last one 06/15/21 negative for cancer May see limited lung fields on Pre ablation CT planned 06/23/23  Discussed LE edema being a combination of lymphedema and venous dx. Needs sllep study Seeing wellness center and on contrave for weight loss. Still considering combined ablation /watchman procedure   Past Medical History:  Diagnosis Date   Back pain    Back pain    Baker's cyst of knee, left    Bilateral swelling of feet and ankles    Coronary artery disease    Elevated blood pressure reading 12/23/2017   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   Fatty liver    GERD (gastroesophageal reflux disease)    Joint pain     Morbid obesity (HCC)    Myocardial infarction (HCC)    Smoking greater than 10 pack years 06/18/2012   Tobacco abuse     Past Surgical History:  Procedure Laterality Date   BREAST BIOPSY Left 01/25/2022   MM LT BREAST BX W LOC DEV 1ST LESION IMAGE BX SPEC STEREO GUIDE 01/25/2022 [facility]   BREAST BIOPSY Left 01/25/2022   MM LT BREAST BX W LOC DEV EA AD LESION IMG BX SPEC STEREO GUIDE 01/25/2022 [facility]   CORONARY STENT INTERVENTION N/A 12/24/2017   Procedure: CORONARY STENT INTERVENTION;  Surgeon: Avanell Leigh, MD;  Location: [facility];  Service: Cardiovascular;  Laterality: N/A;   CORONARY STENT PLACEMENT  12/24/2017   LEFT HEART CATH AND CORONARY ANGIOGRAPHY N/A 12/24/2017   Procedure: LEFT HEART CATH AND CORONARY ANGIOGRAPHY;  Surgeon: Avanell Leigh, MD;  Location: [facility] INVASIVE CV LAB;  Service: Cardiovascular;  Laterality: N/A;   TONSILLECTOMY       Medications: Current Meds  Medication Sig   apixaban  (ELIQUIS ) 5 MG TABS tablet Take 1 tablet (5 mg total) by mouth 2 (two) times daily.   aspirin  EC 81 MG tablet Take 1 tablet (81 mg total) by mouth daily. Swallow whole.   atorvastatin  (LIPITOR ) 80 MG tablet Take 1 tablet (80 mg total) by mouth daily at 6 PM.   CONTRAVE 8-90  MG TB12 Take by mouth.   metoprolol  tartrate (LOPRESSOR ) 25 MG tablet Take 1 tablet (25 mg total) by mouth 2 (two) times daily.   nitroGLYCERIN  (NITROSTAT ) 0.4 MG SL tablet Place 1 tablet (0.4 mg total) under the tongue every 5 (five) minutes as needed for chest pain.   pyridOXINE (B-6) 50 MG tablet Take 100 mg by mouth daily.     Allergies: No Active Allergies   Social History: The patient  reports that she has quit smoking. Her smoking use included cigarettes. She has never used smokeless tobacco. She reports current alcohol use of about 2.0 - 3.0 standard drinks of alcohol per week. She reports that she does not use drugs.   Family History: The patient's family history  includes Alcohol abuse in her mother; Anxiety disorder in her mother; Cancer in her father; Depression in her mother; Diverticulitis in her mother and sister; Healthy in her sister; Kidney disease in her mother; Pancreatic cancer in her father; Thyroid  disease in her mother; Uterine cancer in her maternal grandmother.   Review of Systems: Please see the history of present illness.   All other systems are reviewed and negative.   Physical Exam: VS:  BP 136/76 (BP Location: Left Arm)   Pulse 60   Ht 5' 4 (1.626 m)   Wt 277 lb (125.6 kg)   SpO2 96%   BMI 47.55 kg/m  .  BMI Body mass index is 47.55 kg/m.  Wt Readings from Last 3 Encounters:  06/22/23 277 lb (125.6 kg)  11/29/22 260 lb (117.9 kg)  06/26/22 267 lb (121.1 kg)   Affect appropriate Obese  HEENT: normal Neck supple with no adenopathy JVP normal no bruits no thyromegaly Lungs clear with no wheezing and good diaphragmatic motion Heart:  S1/S2 no murmur, no rub, gallop or click PMI normal Abdomen: benighn, BS positve, no tenderness, no AAA no bruit.  No HSM or HJR Distal pulses intact with no bruits LE varicosities and plus one bilateral edema Neuro non-focal Skin warm and dry No muscular weakness    LABORATORY DATA:  EKG:  SR LAE nonspecific ST changes 02/16/20   Lab Results  Component Value Date   WBC 7.5 06/06/2021   HGB 14.5 06/06/2021   HCT 41.9 06/06/2021   PLT 288 06/06/2021   GLUCOSE 108 (H) 06/06/2021   CHOL 163 02/16/2020   TRIG 79 02/16/2020   HDL 53 02/16/2020   LDLCALC 95 02/16/2020   ALT 22 08/13/2020   AST 18 08/13/2020   NA 140 06/06/2021   K 4.4 06/06/2021   CL 103 06/06/2021   CREATININE 0.87 06/06/2021   BUN 13 06/06/2021   CO2 24 06/06/2021   TSH 2.170 02/16/2020   INR 0.99 12/24/2017   HGBA1C 5.6 02/16/2020     BNP (last 3 results) No results for input(s): BNP in the last 8760 hours.  ProBNP (last 3 results) No results for input(s): PROBNP in the last 8760  hours.   Other Studies Reviewed Today:  CORONARY STENT INTERVENTION 12/2017  LEFT HEART CATH AND CORONARY ANGIOGRAPHY   Conclusion    Prox Cx to Mid Cx lesion is 90% stenosed. Ost LAD to Prox LAD lesion is 30% stenosed. Prox LAD to Mid LAD lesion is 30% stenosed. A drug-eluting stent was successfully placed. Post intervention, there is a 0% residual stenosis.    Echo Study Conclusions 12/2017  - Left ventricle: The cavity size was normal. Wall thickness was    increased in a pattern of  mild LVH. Systolic function was    vigorous. The estimated ejection fraction was in the range of 65%    to 70%. Wall motion was normal; there were no regional wall    motion abnormalities. Doppler parameters are consistent with    abnormal left ventricular relaxation (grade 1 diastolic    dysfunction). The E/e&' ratio is <8, suggesting normal LV filling    pressure.  - Mitral valve: Mildly thickened leaflets . There was mild    regurgitation.  - Left atrium: The atrium was mildly dilated.  - Right atrium: The atrium was mildly dilated.  - Tricuspid valve: There was mild regurgitation.  - Pulmonary arteries: PA peak pressure: 27 mm Hg (S).  - Inferior vena cava: The vessel was normal in size. The    respirophasic diameter changes were in the normal range (>= 50%),    consistent with normal central venous pressure.   Impressions:   - LVEF 65-70%, mild LVH, normal wall motion, grade 1 DD, normal LV    filling pressure, mild MR, mild biatrial enlargement, mild TR,    RVSP 27 mmHg, normal IVC.    ASSESSMENT AND PLAN:   1. New onset AF - back in NSR - sleep study ordered continue eliquis  and lopressor  F/U Dr Marven Slimmer for ablation PV CTA ordered   2. CAD - prior MI with DES to mLCX for single vessel disease - 81 mg ASA and  Eliquis  per the AF clinic -Myovue normal 03/03/20 [id number] 3. HTN - Well controlled.  Continue current medications and low sodium Dash type diet.    4. HLD - LDL 95  continue high dose lipitor    5. Obesity - now in the Weight Loss clinic - Ok to use Plenity supplement   6. Concern for OSA - sleep study ordered by AF clinic 02/16/20  pending still   7. Prior smoker - not smoking. CT 06/15/21 < 5 mm stable nodules needs updating  8. Arthritis:  ?  F/u primary auto immune labs benign   LE venous duplex for reflux Home sleep study F/U Turner OsA F/U Marven Slimmer ? Ablation /Watchman pending insurance soon    Disposition:   FU EP 3 months , F/U Turner post sleep study, F/U me in a year    Patient is agreeable to this plan and will call if any problems develop in the interim.   Signed: Janelle Mediate, MD  06/22/2023 9:44 AM  [facility] [address] [place], [place]  [address] Phone: [phone] Fax: [phone]

## 2023-06-15 ENCOUNTER — Ambulatory Visit: Admitting: Podiatry

## 2023-06-15 ENCOUNTER — Ambulatory Visit (INDEPENDENT_AMBULATORY_CARE_PROVIDER_SITE_OTHER): Admitting: Podiatry

## 2023-06-15 DIAGNOSIS — Q666 Other congenital valgus deformities of feet: Secondary | ICD-10-CM

## 2023-06-15 DIAGNOSIS — M25471 Effusion, right ankle: Secondary | ICD-10-CM

## 2023-06-15 DIAGNOSIS — M7672 Peroneal tendinitis, left leg: Secondary | ICD-10-CM | POA: Diagnosis not present

## 2023-06-15 DIAGNOSIS — M25472 Effusion, left ankle: Secondary | ICD-10-CM

## 2023-06-15 NOTE — Progress Notes (Signed)
 Subjective:  Patient ID: Gina Morris, female    DOB: 05-16-1955,  MRN: [id number]  Chief Complaint  Patient presents with   Numbness    Pt stated that she has numbness in her toes and she has pain on the side of her foot     68 y.o. female presents with the above complaint.  Patient presents with complaint of left peroneal tendon pain.  Patient states pain on the outside of the foot has been there for quite some time is progressive gotten worse worse with ambulation as a pressure she would like to discuss treatment options for has not seen her most prior to seeing me for this pain scale 7 out of 10 dull aching nature.  There is no swelling noted as well.   Review of Systems: Negative except as noted in the HPI. Denies N/V/F/Ch.  Past Medical History:  Diagnosis Date   Back pain    Back pain    Baker's cyst of knee, left    Bilateral swelling of feet and ankles    Coronary artery disease    Elevated blood pressure reading 12/23/2017   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   Fatty liver    GERD (gastroesophageal reflux disease)    Joint pain    Morbid obesity (HCC)    Myocardial infarction (HCC)    Smoking greater than 10 pack years 06/18/2012   Tobacco abuse     Current Outpatient Medications:    apixaban  (ELIQUIS ) 5 MG TABS tablet, Take 1 tablet (5 mg total) by mouth 2 (two) times daily., Disp: 180 tablet, Rfl: 3   aspirin  EC 81 MG tablet, Take 1 tablet (81 mg total) by mouth daily. Swallow whole., Disp: 90 tablet, Rfl: 3   atorvastatin  (LIPITOR ) 80 MG tablet, Take 1 tablet (80 mg total) by mouth daily at 6 PM., Disp: 90 tablet, Rfl: 0   CONTRAVE 8-90 MG TB12, Take by mouth., Disp: , Rfl:    ergocalciferol  (VITAMIN D2) 1.25 MG (50000 UT) capsule, Take 50,000 Units by mouth., Disp: , Rfl:    metoprolol  tartrate (LOPRESSOR ) 25 MG tablet, Take 1 tablet (25 mg total) by mouth 2 (two) times daily., Disp: 180 tablet, Rfl: 0   nitroGLYCERIN  (NITROSTAT ) 0.4 MG SL tablet, Place 1 tablet (0.4  mg total) under the tongue every 5 (five) minutes as needed for chest pain., Disp: 25 tablet, Rfl: 3  Social History   Tobacco Use  Smoking Status Former   Current packs/day: 0.50   Types: Cigarettes  Smokeless Tobacco Never  Tobacco Comments   quit after heart attack 2019    No Active Allergies Objective:  There were no vitals filed for this visit. There is no height or weight on file to calculate BMI. Constitutional Well developed. Well nourished.  Vascular Dorsalis pedis pulses palpable bilaterally. Posterior tibial pulses palpable bilaterally. Capillary refill normal to all digits.  No cyanosis or clubbing noted. Pedal hair growth normal.  Neurologic Normal speech. Oriented to person, place, and time. Epicritic sensation to light touch grossly present bilaterally.  Dermatologic Nails well groomed and normal in appearance. No open wounds. No skin lesions.  Orthopedic: Pain on palpation along the course of the peroneal tendon pain with resisted dorsiflexion eversion of the foot no pain with plantarflexion eversion of the foot.  No pain at the Achilles tendon posterior tibial tendon ATFL ligament.   Radiographs: None Assessment:   1. Ankle edema, bilateral   2. Peroneal tendinitis, left   3. Pes planovalgus  Plan:  Patient was evaluated and treated and all questions answered.  Bilateral leg edema - Patient was given a prescription for compression socks to help decrease edema.  Patient will obtain them from medical supply store  Left peroneal tendinitis - All questions or concerns were discussed with the patient in extensive detail given the amount of pain that she is having should benefit from cam boot immobilization.  Patient agrees with plan like to proceed with cam boot immobilization Cam boot was dispensed   Pes planovalgus -I explained to patient the etiology of pes planovalgus and relationship with peroneal tendinitis and various treatment options were  discussed.  Given patient foot structure in the setting of peroneal tendinitis believe patient will benefit from custom-made orthotics to help control the hindfoot motion support the arch of the foot and take the stress away from plantar fascial.  Patient agrees with the plan like to proceed with orthotics -Patient was casted for orthotics   No follow-ups on file.

## 2023-06-22 ENCOUNTER — Telehealth: Payer: Self-pay | Admitting: Radiology

## 2023-06-22 ENCOUNTER — Ambulatory Visit: Payer: BC Managed Care – PPO | Attending: Cardiovascular Disease | Admitting: Cardiovascular Disease

## 2023-06-22 VITALS — BP 136/76 | HR 60 | Ht 64.0 in | Wt 277.0 lb

## 2023-06-22 DIAGNOSIS — I10 Essential (primary) hypertension: Secondary | ICD-10-CM

## 2023-06-22 DIAGNOSIS — E66813 Obesity, class 3: Secondary | ICD-10-CM | POA: Diagnosis not present

## 2023-06-22 DIAGNOSIS — R600 Localized edema: Secondary | ICD-10-CM

## 2023-06-22 DIAGNOSIS — I480 Paroxysmal atrial fibrillation: Secondary | ICD-10-CM | POA: Diagnosis not present

## 2023-06-22 DIAGNOSIS — Z6841 Body Mass Index (BMI) 40.0 and over, adult: Secondary | ICD-10-CM

## 2023-06-22 DIAGNOSIS — I2510 Atherosclerotic heart disease of native coronary artery without angina pectoris: Secondary | ICD-10-CM

## 2023-06-22 NOTE — Telephone Encounter (Signed)
 Patient agreement reviewed and signed on 06/22/2023.  WatchPAT issued to patient on 06/22/2023 by Nathalie Baize. Patient aware to not open the WatchPAT box until contacted with the activation PIN. Patient profile initialized in CloudPAT on 06/22/2023 by Jenise Mixer. Device serial number: [id number]  Please list Reason for Call as Advice Only and type WatchPAT issued to patient in the comment box.

## 2023-06-22 NOTE — Patient Instructions (Addendum)
 Medication Instructions:  Your physician recommends that you continue on your current medications as directed. Please refer to the Current Medication list given to you today.  *If you need a refill on your cardiac medications before your next appointment, please call your pharmacy*  Lab Work: If you have labs (blood work) drawn today and your tests are completely normal, you will receive your results only by: MyChart Message (if you have MyChart) OR A paper copy in the mail If you have any lab test that is abnormal or we need to change your treatment, we will call you to review the results.  Testing/Procedures: Your physician has recommended that you have a sleep study. This test records several body functions during sleep, including: brain activity, eye movement, oxygen and carbon dioxide blood levels, heart rate and rhythm, breathing rate and rhythm, the flow of air through your mouth and nose, snoring, body muscle movements, and chest and belly movement.  Your physician has requested that you have a lower extremity venous duplex. This test is an ultrasound of the veins in the legs. It looks at venous blood flow that carries blood from the heart to the legs. Allow one hour for a Lower Venous exam. There are no restrictions or special instructions.  Please note: We ask at that you not bring children with you during ultrasound (echo/ vascular) testing. Due to room size and safety concerns, children are not allowed in the ultrasound rooms during exams. Our front office staff cannot provide observation of children in our lobby area while testing is being conducted. An adult accompanying a patient to their appointment will only be allowed in the ultrasound room at the discretion of the ultrasound technician under special circumstances. We apologize for any inconvenience.   Follow-Up: At [facility], you and your health needs are our priority.  As part of our continuing mission to provide  you with exceptional heart care, our providers are all part of one team.  This team includes your primary Cardiologist (physician) and Advanced Practice Providers or APPs (Physician Assistants and Nurse Practitioners) who all work together to provide you with the care you need, when you need it.  Your next appointment:   1 year(s)  Provider:   Janelle Mediate, MD    We recommend signing up for the patient portal called MyChart.  Sign up information is provided on this After Visit Summary.  MyChart is used to connect with patients for Virtual Visits (Telemedicine).  Patients are able to view lab/test results, encounter notes, upcoming appointments, etc.  Non-urgent messages can be sent to your provider as well.   To learn more about what you can do with MyChart, go to [email or web address].   Your physician recommends that you schedule a follow-up appointment in: 3 months with Dr. Marven Slimmer

## 2023-06-28 ENCOUNTER — Encounter: Payer: Self-pay | Admitting: Cardiology

## 2023-06-28 ENCOUNTER — Encounter: Payer: Self-pay | Admitting: Cardiovascular Disease

## 2023-06-29 NOTE — Telephone Encounter (Signed)
**Note De-Identified Byrne Capek Obfuscation** I called the pt and advised her that I could not do a PA over the phone with [employer] [place]  as they do not allow it and that I have completed a PA form for her [facility] and have faxed it and all clinicals to them. She is aware that I will call her with update after I hear back from [facility] [place] .  She verbalized understanding and thanked me for my call.

## 2023-07-02 ENCOUNTER — Telehealth: Payer: Self-pay

## 2023-07-02 NOTE — Telephone Encounter (Signed)
**Note De-Identified Gina Morris Obfuscation** Ordering provider: Delford Associated diagnoses: Snoring-R06.83 and Fatigue-R53.83  WatchPAT PA obtained on 07/02/2023 by Gina Morris, Gina HERO, LPN. Authorization: Per Letter received Gina Morris fax from [employer] [place] , a PA is not required for CPT Code: 04199 ([place]-HST). Valid dates: 07/02/2023-10/02/2023 Reference #: [id number]  Patient notified of PIN (1234) on 07/02/2023 Gina Morris phone.  Phone note routed to covering staff for follow-up.

## 2023-07-04 ENCOUNTER — Encounter (INDEPENDENT_AMBULATORY_CARE_PROVIDER_SITE_OTHER): Payer: Self-pay | Admitting: Cardiology

## 2023-07-04 DIAGNOSIS — G4733 Obstructive sleep apnea (adult) (pediatric): Secondary | ICD-10-CM | POA: Diagnosis not present

## 2023-07-05 ENCOUNTER — Encounter: Payer: Self-pay | Admitting: Podiatry

## 2023-07-11 ENCOUNTER — Encounter: Payer: Self-pay | Admitting: Cardiovascular Disease

## 2023-07-13 ENCOUNTER — Ambulatory Visit (HOSPITAL_COMMUNITY)
Admission: RE | Admit: 2023-07-13 | Discharge: 2023-07-13 | Disposition: A | Discharge status: Home / Self Care | Source: Ambulatory Visit | Attending: Cardiovascular Disease | Admitting: Cardiovascular Disease

## 2023-07-13 ENCOUNTER — Ambulatory Visit: Payer: Self-pay | Admitting: Cardiovascular Disease

## 2023-07-13 DIAGNOSIS — E66813 Obesity, class 3: Secondary | ICD-10-CM | POA: Diagnosis present

## 2023-07-13 DIAGNOSIS — I10 Essential (primary) hypertension: Secondary | ICD-10-CM | POA: Insufficient documentation

## 2023-07-13 DIAGNOSIS — Z6841 Body Mass Index (BMI) 40.0 and over, adult: Secondary | ICD-10-CM | POA: Diagnosis present

## 2023-07-13 DIAGNOSIS — I2510 Atherosclerotic heart disease of native coronary artery without angina pectoris: Secondary | ICD-10-CM | POA: Insufficient documentation

## 2023-07-13 DIAGNOSIS — I872 Venous insufficiency (chronic) (peripheral): Secondary | ICD-10-CM

## 2023-07-13 DIAGNOSIS — R600 Localized edema: Secondary | ICD-10-CM | POA: Insufficient documentation

## 2023-07-13 DIAGNOSIS — G4733 Obstructive sleep apnea (adult) (pediatric): Secondary | ICD-10-CM

## 2023-07-13 DIAGNOSIS — I480 Paroxysmal atrial fibrillation: Secondary | ICD-10-CM | POA: Diagnosis present

## 2023-07-15 NOTE — Procedures (Signed)
   SLEEP STUDY REPORT Patient Information Study Date: 07/04/2023 Patient Name: Gina Morris Patient ID: [id number] Birth Date: 1955-11-06 Age: 68 Gender: Female BMI: 47.4 (W=278 lb, H=5' 4'') Stopbang: 6 Referring Physician: Maude Emmer, MD  TEST DESCRIPTION: Home sleep apnea testing was completed using the WatchPat, a Type 1 device, utilizing peripheral arterial tonometry (PAT), chest movement, actigraphy, pulse oximetry, pulse rate, body position and snore. AHI was calculated with apnea and hypopnea using valid sleep time as the denominator. RDI includes apneas, hypopneas, and RERAs. The data acquired and the scoring of sleep and all associated events were performed in accordance with the recommended standards and specifications as outlined in the AASM Manual for the Scoring of Sleep and Associated Events 2.2.0 (2015).  FINDINGS:  1. Mild Obstructive Sleep Apnea with AHI 9.3/hr overall and severe during REM sleep with REM AHI 32.3/hr.  2. No Central Sleep Apnea with pAHIc 0/hr.  3. Oxygen desaturations as low as 85%.  4. Moderate snoring was present. O2 sats were < 88% for 0.3 min.  5. Total sleep time was 5 hrs and 49 min.  6. 19.2% of total sleep time was spent in REM sleep.  7. Shortened sleep onset latency at 6 min.  8. Shortened REM sleep onset latency at 83 min.  9. Total awakenings were 11. 10. Arrhythmia detection: Suggestive of possible brief atrial fibrillation lasting 44 seconds. This is not diagnostic and further testing with outpatient telemetry monitoring is recommended.  DIAGNOSIS: Mild Obstructive Sleep Apnea (G47.33) overall but Severe Obstructive Sleep Apnea during REM sleep. Possible Atrial Fibrillation  RECOMMENDATIONS: 1. Clinical correlation of these findings is necessary. The decision to treat obstructive sleep apnea (OSA) is usually based on the presence of apnea symptoms or the presence of associated medical conditions such as  Hypertension, Congestive Heart Failure, Atrial Fibrillation or Obesity. The most common symptoms of OSA are snoring, gasping for breath while sleeping, daytime sleepiness and fatigue. 2. Initiating apnea therapy is recommended given the presence of symptoms and/or associated conditions. Recommend proceeding with one of the following:  a. Auto-CPAP therapy with a pressure range of 5-20cm H2O.  b. An oral appliance (OA) that can be obtained from certain dentists with expertise in sleep medicine. These are primarily of use in non-obese patients with mild and moderate disease.  c. An ENT consultation which may be useful to look for specific causes of obstruction and possible treatment options.  d. If patient is intolerant to PAP therapy, consider referral to ENT for evaluation for hypoglossal nerve stimulator. 3. Close follow-up is necessary to ensure success with CPAP or oral appliance therapy for maximum benefit . 4. A follow-up oximetry study on CPAP is recommended to assess the adequacy of therapy and determine the need for supplemental oxygen or the potential need for Bi-level therapy. An arterial blood gas to determine the adequacy of baseline ventilation and oxygenation should also be considered. 5. Healthy sleep recommendations include: adequate nightly sleep (normal 7-9 hrs/night), avoidance of caffeine after noon and alcohol near bedtime, and maintaining a sleep environment that is cool, dark and quiet. 6. Weight loss for overweight patients is recommended. Even modest amounts of weight loss can significantly improve the severity of sleep apnea. 7. Snoring recommendations include: weight loss where appropriate, side sleeping, and avoidance of alcohol before bed. 8. Operation of motor vehicle should be avoided when sleepy.  Signature: Wilbert Bihari, MD; [facility]; Diplomat, American Board of Sleep Medicine Electronically Signed: 07/15/2023 4:49:17 PM

## 2023-07-16 ENCOUNTER — Ambulatory Visit: Attending: Cardiovascular Disease

## 2023-07-16 DIAGNOSIS — E66813 Obesity, class 3: Secondary | ICD-10-CM

## 2023-07-16 DIAGNOSIS — I10 Essential (primary) hypertension: Secondary | ICD-10-CM

## 2023-07-16 DIAGNOSIS — R600 Localized edema: Secondary | ICD-10-CM

## 2023-07-16 DIAGNOSIS — I480 Paroxysmal atrial fibrillation: Secondary | ICD-10-CM

## 2023-07-16 DIAGNOSIS — I2510 Atherosclerotic heart disease of native coronary artery without angina pectoris: Secondary | ICD-10-CM

## 2023-07-16 NOTE — Telephone Encounter (Signed)
-----   Message from Maude Emmer sent at 07/13/2023 12:31 PM EDT ----- No DVT venous reflux ? Does she have VVS f/u  ----- Message ----- From: Interface, [employer] Sent: 07/13/2023  11:02 AM EDT To: Maude JAYSON Emmer, MD

## 2023-07-16 NOTE — Telephone Encounter (Signed)
 The patient has been notified of the result and verbalized understanding.  All questions (if any) were answered. Sharlet Lerner Bronson, RN 07/16/2023 11:32 AM   Placed order for VVS referral.

## 2023-07-18 ENCOUNTER — Telehealth: Payer: Self-pay

## 2023-07-18 NOTE — Telephone Encounter (Signed)
 Left VM with callback number for patient to receive sleep study results and recommendations.

## 2023-07-18 NOTE — Telephone Encounter (Signed)
-----   Message from Wilbert Bihari sent at 07/15/2023  5:36 PM EDT ----- Please let patient know that they have sleep apnea.  Recommend therapeutic CPAP titration for treatment of patient's sleep disordered breathing.

## 2023-07-19 ENCOUNTER — Telehealth: Payer: Self-pay

## 2023-07-19 NOTE — Telephone Encounter (Signed)
 Notified patient of sleep study results and recommendations.  Patient is not interested in CPAP Therapy and wants to know OSA Therapy options. Message sent to provider for recommendations.

## 2023-07-20 ENCOUNTER — Telehealth: Payer: Self-pay

## 2023-07-20 ENCOUNTER — Other Ambulatory Visit

## 2023-07-20 ENCOUNTER — Ambulatory Visit (INDEPENDENT_AMBULATORY_CARE_PROVIDER_SITE_OTHER): Admitting: Podiatry

## 2023-07-20 DIAGNOSIS — I10 Essential (primary) hypertension: Secondary | ICD-10-CM

## 2023-07-20 DIAGNOSIS — M7672 Peroneal tendinitis, left leg: Secondary | ICD-10-CM | POA: Diagnosis not present

## 2023-07-20 DIAGNOSIS — R600 Localized edema: Secondary | ICD-10-CM

## 2023-07-20 DIAGNOSIS — G4733 Obstructive sleep apnea (adult) (pediatric): Secondary | ICD-10-CM

## 2023-07-20 DIAGNOSIS — Q666 Other congenital valgus deformities of feet: Secondary | ICD-10-CM

## 2023-07-20 DIAGNOSIS — I480 Paroxysmal atrial fibrillation: Secondary | ICD-10-CM

## 2023-07-20 NOTE — Progress Notes (Signed)
 Subjective:  Patient ID: Gina Morris, female    DOB: 02-15-1955,  MRN: [id number]  Chief Complaint  Patient presents with   Foot Pain    Lt outside of foot still hurting.  Wore boot for a week, not wearing any more.  Here for possible injetion.  Also here for orthotic fitting    68 y.o. female presents with the above complaint.  Patient presents with complaint of left peroneal tendinitis.  She states the cam boot immobilization did help she was not able to tolerate the boot.  She would like to discuss next treatment plan.  She is also here to pick up her orthotics  Review of Systems: Negative except as noted in the HPI. Denies N/V/F/Ch.  Past Medical History:  Diagnosis Date   Back pain    Back pain    Baker's cyst of knee, left    Bilateral swelling of feet and ankles    Coronary artery disease    Elevated blood pressure reading 12/23/2017   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   Fatty liver    GERD (gastroesophageal reflux disease)    Joint pain    Morbid obesity (HCC)    Myocardial infarction (HCC)    Smoking greater than 10 pack years 06/18/2012   Tobacco abuse     Current Outpatient Medications:    apixaban  (ELIQUIS ) 5 MG TABS tablet, Take 1 tablet (5 mg total) by mouth 2 (two) times daily., Disp: 180 tablet, Rfl: 3   aspirin  EC 81 MG tablet, Take 1 tablet (81 mg total) by mouth daily. Swallow whole., Disp: 90 tablet, Rfl: 3   atorvastatin  (LIPITOR ) 80 MG tablet, Take 1 tablet (80 mg total) by mouth daily at 6 PM., Disp: 90 tablet, Rfl: 0   CONTRAVE 8-90 MG TB12, Take by mouth., Disp: , Rfl:    ergocalciferol  (VITAMIN D2) 1.25 MG (50000 UT) capsule, Take 50,000 Units by mouth., Disp: , Rfl:    metoprolol  tartrate (LOPRESSOR ) 25 MG tablet, Take 1 tablet (25 mg total) by mouth 2 (two) times daily., Disp: 180 tablet, Rfl: 0   nitroGLYCERIN  (NITROSTAT ) 0.4 MG SL tablet, Place 1 tablet (0.4 mg total) under the tongue every 5 (five) minutes as needed for chest pain., Disp: 25 tablet,  Rfl: 3   pyridOXINE (B-6) 50 MG tablet, Take 100 mg by mouth daily., Disp: , Rfl:   Social History   Tobacco Use  Smoking Status Former   Current packs/day: 0.50   Types: Cigarettes  Smokeless Tobacco Never  Tobacco Comments   quit after heart attack 2019    No Active Allergies Objective:  There were no vitals filed for this visit. There is no height or weight on file to calculate BMI. Constitutional Well developed. Well nourished.  Vascular Dorsalis pedis pulses palpable bilaterally. Posterior tibial pulses palpable bilaterally. Capillary refill normal to all digits.  No cyanosis or clubbing noted. Pedal hair growth normal.  Neurologic Normal speech. Oriented to person, place, and time. Epicritic sensation to light touch grossly present bilaterally.  Dermatologic Nails well groomed and normal in appearance. No open wounds. No skin lesions.  Orthopedic: Pain on palpation along the course of the peroneal tendon pain with resisted dorsiflexion eversion of the foot no pain with plantarflexion eversion of the foot.  No pain at the Achilles tendon posterior tibial tendon ATFL ligament.   Radiographs: None Assessment:   No diagnosis found.  Plan:  Patient was evaluated and treated and all questions answered.  Bilateral leg edema -  Patient was given a prescription for compression socks to help decrease edema.  Patient will obtain them from medical supply store  Left peroneal tendinitis - All questions or concerns were discussed with the patient in extensive detail clinically cam boot immobilization did bring her pain down.  She still having some residual pain she would like to discuss steroid injection I discussed the risk of rupture associate with it.  She would like to proceed with the injection -A steroid injection was performed at left lateral foot pain on final maximal tenderness using 1% plain Lidocaine  and 10 mg of Kenalog. This was well tolerated.  Pes planovalgus -I  explained to patient the etiology of pes planovalgus and relationship with peroneal tendinitis and various treatment options were discussed.  Given patient foot structure in the setting of peroneal tendinitis believe patient will benefit from custom-made orthotics to help control the hindfoot motion support the arch of the foot and take the stress away from plantar fascial.  Patient agrees with the plan like to proceed with orthotics - Orthotics were dispensed and they are functioning well.   No follow-ups on file.

## 2023-07-20 NOTE — Addendum Note (Signed)
 Addended by: DRENA MARTINIS, Amyri Frenz L on: 07/20/2023 06:11 PM   Modules accepted: Orders

## 2023-07-20 NOTE — Telephone Encounter (Signed)
-----   Message from Wilbert Bihari sent at 07/15/2023  5:36 PM EDT ----- Please let patient know that they have sleep apnea.  Recommend therapeutic CPAP titration for treatment of patient's sleep disordered breathing.

## 2023-08-02 ENCOUNTER — Other Ambulatory Visit: Payer: Self-pay | Admitting: Cardiovascular Disease

## 2023-08-03 ENCOUNTER — Other Ambulatory Visit: Payer: Self-pay | Admitting: Cardiovascular Disease

## 2023-08-06 NOTE — Telephone Encounter (Signed)
 Patient received her results and saw the sleep doctor.

## 2023-08-15 NOTE — Progress Notes (Signed)
  Electrophysiology Office Follow up Visit Note:    Date:  08/16/2023   ID:  Gina Morris, DOB Jan 22, 1955, MRN [id number]  PCP:  Patient, No Pcp Per  [facility] Cardiologist:  Maude Emmer, MD  [facility] Electrophysiologist:  OLE ONEIDA HOLTS, MD    Interval History:     Gina Morris is a 68 y.o. female who presents for a follow up visit.   I last saw the patient November 29, 2022.  She was previously scheduled for atrial fibrillation ablation but she canceled the procedure.  At the last appointment we also discussed dofetilide.  At her last video visit in November 2024, we discussed catheter ablation and left atrial appendage occlusion.  She was going to think about her options and let us  know if she wanted to proceed.  She tells me that her atrial fibrillation burden has been increasing recently.  Episodes are lasting 24 to 48 hours.  When she has atrial fibrillation she has balance issues, more anxiety and shortness of breath.       Past medical, surgical, social and family history were reviewed.  ROS:   Please see the history of present illness.    All other systems reviewed and are negative.  EKGs/Labs/Other Studies Reviewed:    The following studies were reviewed today:          Physical Exam:    VS:  BP 122/80 (BP Location: Left Arm, Patient Position: Sitting, Cuff Size: Large)   Pulse 86   Ht 5' 4 (1.626 m)   Wt 271 lb (122.9 kg)   SpO2 97%   BMI 46.52 kg/m     Wt Readings from Last 3 Encounters:  08/16/23 271 lb (122.9 kg)  06/22/23 277 lb (125.6 kg)  11/29/22 260 lb (117.9 kg)     GEN: no distress CARD: RRR, No MRG RESP: No IWOB. CTAB.      ASSESSMENT:    1. Primary hypertension   2. PAF (paroxysmal atrial fibrillation) (HCC)   3. Coronary artery disease involving native coronary artery of native heart without angina pectoris    PLAN:    In order of problems listed above:  #Atrial fibrillation Symptomatic.  I again  discussed treatment options including conservative therapy, antiarrhythmic drugs and catheter ablation.  I discussed the catheter ablation and Watchman procedures in detail today.  We discussed risks, recovery and likelihood of success.  We also spent a great deal of time discussing excess weight and how obesity impacts success rates of both procedures.  She has bariatric treatment covered by her workplace and she is interested in enrolling in that.  We discussed how significant weight loss can significantly reduce A-fib burden.  We decided to proceed with that strategy for now.  We will plan to touch base about 9 months for now.  If she continues to have symptomatic atrial fibrillation, can revisit ablation and watchman at that time.  We will have our team check on her insurance to see if it covers concomitant procedures.  Follow-up 9 months with APP    Signed, OLE HOLTS, MD, [facility], [facility] 08/16/2023 8:44 AM    Electrophysiology  [facility]

## 2023-08-16 ENCOUNTER — Ambulatory Visit: Attending: Cardiology | Admitting: Cardiology

## 2023-08-16 ENCOUNTER — Encounter: Payer: Self-pay | Admitting: Cardiology

## 2023-08-16 VITALS — BP 122/80 | HR 86 | Ht 64.0 in | Wt 271.0 lb

## 2023-08-16 DIAGNOSIS — I480 Paroxysmal atrial fibrillation: Secondary | ICD-10-CM | POA: Diagnosis not present

## 2023-08-16 DIAGNOSIS — I2510 Atherosclerotic heart disease of native coronary artery without angina pectoris: Secondary | ICD-10-CM | POA: Diagnosis not present

## 2023-08-16 DIAGNOSIS — I10 Essential (primary) hypertension: Secondary | ICD-10-CM | POA: Diagnosis not present

## 2023-08-16 NOTE — Patient Instructions (Signed)
 Medication Instructions:  Your physician recommends that you continue on your current medications as directed. Please refer to the Current Medication list given to you today.  *If you need a refill on your cardiac medications before your next appointment, please call your pharmacy*  Follow-Up: At [facility], you and your health needs are our priority.  As part of our continuing mission to provide you with exceptional heart care, our providers are all part of one team.  This team includes your primary Cardiologist (physician) and Advanced Practice Providers or APPs (Physician Assistants and Nurse Practitioners) who all work together to provide you with the care you need, when you need it.  Your next appointment:   9 months  Provider:   You will see one of the following Advanced Practice Providers on your designated Care Team:   Charlies Arthur, PA-C Michael Andy Tillery, PA-C Suzann Riddle, NP Daphne Barrack, NP

## 2023-08-17 ENCOUNTER — Other Ambulatory Visit: Payer: Self-pay | Admitting: Medical Genetics

## 2023-08-20 NOTE — Telephone Encounter (Signed)
 error

## 2023-08-29 NOTE — Progress Notes (Unsigned)
 Patient ID: Gina Morris, female   DOB: [birth year], 68 y.o.   MRN: [id number]  Reason for Consult: Varicose Veins   Referred by Delford Maude BROCKS, MD  Subjective:     HPI  Gina Morris is a 68 y.o. female who presents for evaluation of lower extremity swelling Timeframe: Over 5 years Symptoms: Heaviness Varicosities: No Previous wounds: No Previous DVT: No In compression: Intermittently  Past Medical History:  Diagnosis Date   Back pain    Back pain    Baker's cyst of knee, left    Bilateral swelling of feet and ankles    Coronary artery disease    Elevated blood pressure reading 12/23/2017   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   Fatty liver    GERD (gastroesophageal reflux disease)    Joint pain    Morbid obesity (HCC)    Myocardial infarction (HCC)    Smoking greater than 10 pack years 06/18/2012   Tobacco abuse    Family History  Problem Relation Age of Onset   Kidney disease Mother    Diverticulitis Mother    Thyroid  disease Mother    Depression Mother    Anxiety disorder Mother    Alcohol abuse Mother    Pancreatic cancer Father    Cancer Father    Healthy Sister    Diverticulitis Sister    Uterine cancer Maternal Grandmother    Colon cancer Neg Hx    Esophageal cancer Neg Hx    Past Surgical History:  Procedure Laterality Date   BREAST BIOPSY Left 01/25/2022   MM LT BREAST BX W LOC DEV 1ST LESION IMAGE BX SPEC STEREO GUIDE 01/25/2022 [facility]   BREAST BIOPSY Left 01/25/2022   MM LT BREAST BX W LOC DEV EA AD LESION IMG BX SPEC STEREO GUIDE 01/25/2022 [facility]   CORONARY STENT INTERVENTION N/A 12/24/2017   Procedure: CORONARY STENT INTERVENTION;  Surgeon: Court Dorn PARAS, MD;  Location: [facility];  Service: Cardiovascular;  Laterality: N/A;   CORONARY STENT PLACEMENT  12/24/2017   LEFT HEART CATH AND CORONARY ANGIOGRAPHY N/A 12/24/2017   Procedure: LEFT HEART CATH AND CORONARY ANGIOGRAPHY;  Surgeon: Court Dorn PARAS, MD;   Location: [facility];  Service: Cardiovascular;  Laterality: N/A;   TONSILLECTOMY      Short Social History:  Social History   Tobacco Use   Smoking status: Former    Current packs/day: 0.50    Types: Cigarettes   Smokeless tobacco: Never   Tobacco comments:    quit after heart attack 2019  Substance Use Topics   Alcohol use: Yes    Alcohol/week: 2.0 - 3.0 standard drinks of alcohol    Types: 1 Glasses of wine, 1 - 2 Cans of beer per week    Comment: wine    No Active Allergies  Current Outpatient Medications  Medication Sig Dispense Refill   apixaban  (ELIQUIS ) 5 MG TABS tablet Take 1 tablet (5 mg total) by mouth 2 (two) times daily. 180 tablet 3   aspirin  EC 81 MG tablet Take 1 tablet (81 mg total) by mouth daily. Swallow whole. 90 tablet 3   atorvastatin  (LIPITOR ) 80 MG tablet Take 1 tablet (80 mg total) by mouth daily. 90 tablet 3   CONTRAVE 8-90 MG TB12 Take by mouth.     metoprolol  tartrate (LOPRESSOR ) 25 MG tablet Take 1 tablet (25 mg total) by mouth 2 (two) times daily. 180 tablet 3   nitroGLYCERIN  (NITROSTAT ) 0.4 MG SL  tablet Place 1 tablet (0.4 mg total) under the tongue every 5 (five) minutes as needed for chest pain. 25 tablet 3   No current facility-administered medications for this visit.    REVIEW OF SYSTEMS All other systems were reviewed and are negative    Objective:  Objective   Vitals:   08/31/23 0939  BP: (!) 140/74  Pulse: 60  Temp: (!) 97.3 F (36.3 C)  TempSrc: Temporal  SpO2: 94%  Weight: 279 lb (126.6 kg)   Body mass index is 47.89 kg/m.  Physical Exam General: no acute distress Cardiac: hemodynamically stable Pulm: normal work of breathing Neuro: alert, no focal deficit Extremities: 2+ edema from ankles to knees, no varicosities, no signs of previous wounds.  Small telangiectasias on anterior shins Vascular:   Right: palpable DP, PT  Left: palpable DP, PT   Data: Reflux study Venous Reflux Times   +--------------+---------+------+-----------+------------+--------+  RIGHT        Reflux NoRefluxReflux TimeDiameter cmsComments                          Yes                                   +--------------+---------+------+-----------+------------+--------+  CFV          no                                              +--------------+---------+------+-----------+------------+--------+  FV prox       no                                              +--------------+---------+------+-----------+------------+--------+  Popliteal    no                                              +--------------+---------+------+-----------+------------+--------+  GSV at SFJ    no                            .67               +--------------+---------+------+-----------+------------+--------+  GSV prox thighno                            .60               +--------------+---------+------+-----------+------------+--------+  GSV mid thigh no                            .46               +--------------+---------+------+-----------+------------+--------+  GSV dist thighno                            .32               +--------------+---------+------+-----------+------------+--------+  GSV at knee   no                            .  40               +--------------+---------+------+-----------+------------+--------+  GSV prox calf           yes    >500 ms      .37               +--------------+---------+------+-----------+------------+--------+  GSV mid calf            yes    >500 ms      .28               +--------------+---------+------+-----------+------------+--------+  GSV dist calf no                            .34               +--------------+---------+------+-----------+------------+--------+  [employer]    no                            .21                +--------------+---------+------+-----------+------------+--------+  SSV prox calf no                            .30               +--------------+---------+------+-----------+------------+--------+  SSV mid calf  no                            .29               +--------------+---------+------+-----------+------------+--------+     +--------------+---------+------+-----------+------------+--------+  LEFT         Reflux NoRefluxReflux TimeDiameter cmsComments                          Yes                                   +--------------+---------+------+-----------+------------+--------+  CFV          no                                              +--------------+---------+------+-----------+------------+--------+  FV prox       no                                              +--------------+---------+------+-----------+------------+--------+  Popliteal    no                                              +--------------+---------+------+-----------+------------+--------+  GSV at SFJ    no                            .92               +--------------+---------+------+-----------+------------+--------+  GSV prox thighno                            .  59               +--------------+---------+------+-----------+------------+--------+  GSV mid thigh no                            .40               +--------------+---------+------+-----------+------------+--------+  GSV dist thighno                            .40               +--------------+---------+------+-----------+------------+--------+  GSV at knee   no                            .49               +--------------+---------+------+-----------+------------+--------+  GSV prox calf no                            .39               +--------------+---------+------+-----------+------------+--------+  GSV mid calf            yes    >500 ms      .23                +--------------+---------+------+-----------+------------+--------+  GSV dist calf           yes    >500 ms      .33               +--------------+---------+------+-----------+------------+--------+  SSV at [facility]    no                            .43               +--------------+---------+------+-----------+------------+--------+  SSV prox calf no                            .42               +--------------+---------+------+-----------+------------+--------+  SSV mid calf  no                            .29               +--------------+---------+------+-----------+------------+--------+        Assessment/Plan:     JAMILEX BOHNSACK is a 68 y.o. female with chronic venous insufficiency with C 3 disease and reflux noted in bilateral mid and distal calf GSV I explained the foundation of CVI treatment of compression and elevation I recommended medical grade graduated compression stockings and intermittent leg elevation We also discussed that many patients find symptom improvement with exercise and if they have access to a pool should attempt water aerobics.  Follow-up as needed      Norman GORMAN Serve MD [facility]

## 2023-08-31 ENCOUNTER — Ambulatory Visit: Attending: Vascular Surgery | Admitting: Vascular Surgery

## 2023-08-31 VITALS — BP 140/74 | HR 60 | Temp 97.3°F | Wt 279.0 lb

## 2023-08-31 DIAGNOSIS — I872 Venous insufficiency (chronic) (peripheral): Secondary | ICD-10-CM | POA: Diagnosis not present

## 2023-08-31 DIAGNOSIS — M7989 Other specified soft tissue disorders: Secondary | ICD-10-CM

## 2023-09-28 ENCOUNTER — Other Ambulatory Visit (HOSPITAL_COMMUNITY)
Admission: RE | Admit: 2023-09-28 | Discharge: 2023-09-28 | Disposition: A | Discharge status: Home / Self Care | Payer: Self-pay | Source: Ambulatory Visit | Attending: Medical Genetics | Admitting: Medical Genetics

## 2023-10-09 LAB — GENECONNECT MOLECULAR SCREEN: Genetic Analysis Overall Interpretation: NEGATIVE

## 2023-10-29 ENCOUNTER — Encounter: Payer: Self-pay | Admitting: Cardiovascular Disease

## 2023-11-16 ENCOUNTER — Encounter: Payer: Self-pay | Admitting: Pulmonary Disease

## 2023-11-16 ENCOUNTER — Ambulatory Visit: Admitting: Pulmonary Disease

## 2023-11-16 VITALS — BP 116/66 | HR 60 | Ht 64.0 in | Wt 279.0 lb

## 2023-11-16 DIAGNOSIS — G4733 Obstructive sleep apnea (adult) (pediatric): Secondary | ICD-10-CM

## 2023-11-16 NOTE — Progress Notes (Signed)
 Gina Morris    [id number]    [birth year]  Primary Care Physician:Patient, No Pcp Per  Referring Physician: No referring provider defined for this encounter.  Chief complaint:   Patient being seen for obstructive sleep apnea Recently diagnosed with mild obstructive sleep apnea with severe number of events during REM sleep Study was interpreted by Dr. Shlomo, had a WatchPAT study  Discussed the use of AI scribe software for clinical note transcription with the patient, who gave verbal consent to proceed.  History of Present Illness Gina Morris is a 68 year old female with mild sleep apnea who presents for evaluation of her sleep disorder. She was referred by her cardiologist for evaluation of her sleep apnea.  She underwent a sleep study which revealed mild sleep apnea that worsens during REM sleep. Initially diagnosed with mild sleep apnea in 2018, she experiences frequent awakenings at night, often to use the bathroom, and rarely sleeps through the night. She feels tired during the day but does not take naps. She wakes up feeling unrested on most days and experiences morning dryness of the mouth and occasional headaches. No night sweats are present.  She has a history of snoring, which her daughter has noticed during visits. She quit smoking around 2018-2019 following a heart attack. She has a history of atrial fibrillation and has been advised to investigate her sleep apnea further due to her cardiac history.  She has undergone low-dose CT scans which have shown stable lung spots from 2023 to 2024 and has been informed of the presence of emphysema. She can walk a mile but may need to stop several times due to her current condition and weight. No shortness of breath during normal activities.  Her family history does not include known cases of sleep apnea.  Usually goes to bed between 9 and 10 PM Takes about 50 minutes to fall asleep 2-3 awakenings Final wake up  time between 630 and 7 she is good to go  Reformed smoker  Outpatient Encounter Medications as of 11/16/2023  Medication Sig   apixaban  (ELIQUIS ) 5 MG TABS tablet Take 1 tablet (5 mg total) by mouth 2 (two) times daily.   aspirin  EC 81 MG tablet Take 1 tablet (81 mg total) by mouth daily. Swallow whole.   atorvastatin  (LIPITOR ) 80 MG tablet Take 1 tablet (80 mg total) by mouth daily.   CONTRAVE 8-90 MG TB12 Take by mouth.   metoprolol  tartrate (LOPRESSOR ) 25 MG tablet Take 1 tablet (25 mg total) by mouth 2 (two) times daily.   nitroGLYCERIN  (NITROSTAT ) 0.4 MG SL tablet Place 1 tablet (0.4 mg total) under the tongue every 5 (five) minutes as needed for chest pain.   No facility-administered encounter medications on file as of 11/16/2023.    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   Fatty liver    GERD (gastroesophageal reflux disease)    Joint pain    Morbid obesity (HCC)    Myocardial infarction (HCC)    Smoking greater than 10 pack years 06/18/2012   Tobacco abuse     Past Surgical History:  Procedure Laterality Date  BREAST BIOPSY Left 01/25/2022   MM LT BREAST BX W LOC DEV 1ST LESION IMAGE BX SPEC STEREO GUIDE 01/25/2022 [facility]   BREAST BIOPSY Left 01/25/2022   MM LT BREAST BX W LOC DEV EA AD LESION IMG BX SPEC STEREO GUIDE 01/25/2022 [facility]   CORONARY STENT INTERVENTION N/A 12/24/2017   Procedure: CORONARY STENT INTERVENTION;  Surgeon: Court Dorn PARAS, MD;  Location: [facility];  Service: Cardiovascular;  Laterality: N/A;   CORONARY STENT PLACEMENT  12/24/2017   LEFT HEART CATH AND CORONARY ANGIOGRAPHY N/A 12/24/2017   Procedure: LEFT HEART CATH AND CORONARY ANGIOGRAPHY;  Surgeon: Court Dorn PARAS, MD;  Location: [facility];  Service: Cardiovascular;  Laterality: N/A;   TONSILLECTOMY      Family History  Problem Relation Age of Onset   Kidney disease Mother    Diverticulitis Mother    Thyroid  disease Mother    Depression Mother    Anxiety disorder Mother    Alcohol abuse Mother    Pancreatic cancer Father    Cancer Father    Healthy Sister    Diverticulitis Sister    Uterine cancer Maternal Grandmother    Colon cancer Neg Hx    Esophageal cancer Neg Hx     Social History   Socioeconomic History   Marital status: Single    Spouse name: Not on file   Number of children: 1   Years of education: Not on file   Highest education level: Not on file  Occupational History   Occupation: [employer]  Tobacco Use   Smoking status: Former    Current packs/day: 0.50    Types: Cigarettes   Smokeless tobacco: Never   Tobacco comments:    quit after heart attack 2019  Vaping Use   Vaping status: Never Used  Substance and Sexual Activity   Alcohol use: Yes    Alcohol/week: 2.0 - 3.0 standard drinks of alcohol    Types: 1 Glasses of wine, 1 - 2 Cans of beer per week    Comment: wine   Drug use: Never   Sexual activity: Yes    Birth control/protection: Post-menopausal  Other Topics Concern   Not on file  Social History Narrative   Not on file   Social Drivers of Health   Financial Resource Strain: Not on file  Food Insecurity: Not on file  Transportation Needs: Not on file  Physical Activity: Not on file  Stress: Not on file (11/05/2022)  Social Connections: Unknown (05/16/2021)   Received from [facility]   Social Network    Social Network: Not on file  Intimate Partner Violence: Unknown (04/07/2021)   Received from [facility]   HITS    Physically Hurt: Not on file    Insult or Talk Down To: Not on file    Threaten Physical Harm: Not on file    Scream or Curse: Not on file    Review of Systems  Respiratory:  Positive for apnea.    Psychiatric/Behavioral:  Positive for sleep disturbance.     Vitals:   11/16/23 0837  BP: 116/66  Pulse: 60  SpO2: 95%     Physical Exam Constitutional:      Appearance: She is obese.  HENT:     Head: Normocephalic.     Nose: Nose normal.     Mouth/Throat:     Mouth: Mucous membranes are moist.     Comments: Crowded oropharynx, Mallampati 3 Eyes:  General: No scleral icterus.    Pupils: Pupils are equal, round, and reactive to light.  Cardiovascular:     Rate and Rhythm: Normal rate and regular rhythm.     Heart sounds: No murmur heard.    No friction rub.  Pulmonary:     Effort: No respiratory distress.     Breath sounds: No stridor. No wheezing or rhonchi.  Musculoskeletal:     Cervical back: No rigidity or tenderness.  Skin:    General: Skin is warm.  Neurological:     Mental Status: She is alert.  Psychiatric:        Mood and Affect: Mood normal.     Data Reviewed: Sleep study reviewed performed in July 2025 showing AHI of 9.3, 32.3 in rem sleep  CT scan 06/15/2021 reviewed, CT scan 07/20/2022 reviewed showing stable bilateral lung nodules, emphysema    Assessment and Plan Assessment & Plan Obstructive sleep apnea Mild obstructive sleep apnea with increased severity during REM sleep. Symptoms include daytime fatigue, snoring, and nocturnal awakenings. Comorbidities such as atrial fibrillation increase the risk of complications if untreated. CPAP is the first-line treatment, with an 80% success rate in improving symptoms. Alternative treatments include oral devices and positional therapy. CPAP is recommended due to its effectiveness in reducing apnea events and improving sleep quality. She expressed reluctance to use CPAP but agreed to try it after discussing its benefits and alternatives. - Initiated CPAP therapy with a nasal mask. - Contacted medical supply company to arrange CPAP delivery and fitting. - Scheduled follow-up in 1-3 months to assess CPAP  compliance and effectiveness. - Educated on CPAP maintenance and compliance requirements. - Discussed alternative treatments such as oral devices if CPAP is not tolerated.  Emphysema Identified on CT scan, likely related to past smoking history. Emphasis on monitoring and lifestyle modifications to prevent progression. - Continue monitoring lung health with regular CT scans. - Encouraged smoking cessation and lifestyle modifications to prevent progression.  Atrial fibrillation Increased risk of recurrence if sleep apnea is untreated. CPAP therapy may reduce the risk of AFib recurrence by improving sleep quality and reducing stress on the heart. - Continue CPAP therapy to potentially reduce AFib recurrence risk.  Will place an order to go to the DME company for initiation of CPAP therapy Auto CPAP 5-20 with heated humidification with patient's mask of choice, prefers to start with a nasal pillow  Graded activities with focus on weight loss efforts recommended  Follow-up in about 3 months to assess compliance and optimize treatment  Encouraged to give us  a call with any significant concerns   No orders of the defined types were placed in this encounter.     Jennet Epley MD [facility] 11/16/2023, 9:12 AM  CC: No ref. provider found

## 2023-11-16 NOTE — Patient Instructions (Signed)
 Follow-up in 1 to 3 months  DME referral for CPAP  Auto CPAP 5-20 with heated humidification with patient's mask of choice - Prefers to start with nose pillows  Call us  with significant concerns  Work on weight loss efforts  Graded exercises as tolerated Keep an eye on your diet  Continue following up with your cardiologist

## 2024-02-08 ENCOUNTER — Ambulatory Visit: Admitting: Pulmonary Disease

## 2024-02-08 ENCOUNTER — Encounter: Payer: Self-pay | Admitting: Pulmonary Disease

## 2024-02-08 VITALS — BP 135/67 | HR 58 | Ht 64.0 in | Wt 283.0 lb

## 2024-02-08 DIAGNOSIS — G4733 Obstructive sleep apnea (adult) (pediatric): Secondary | ICD-10-CM

## 2024-02-08 NOTE — Progress Notes (Signed)
 "              Gina Morris    [id number]    April 09, 1955  Primary Care Physician:Patient, No Pcp Per  Referring Physician: No referring provider defined for this encounter.  Chief complaint:   Patient with obstructive sleep apnea Has been using CPAP on a regular basis Benefiting from CPAP  HPI:  In for follow-up today Has been using CPAP regularly Waking up feeling rested No issues with CPAP Tolerating it well Occasional issues with headgear otherwise tolerated well  She is feeling a lot better with CPAP  Reformed smoker quit in 2019 following a heart attack Has atrial fibrillation which is controlled at present  Sleep habits are unchanged usually to bed between 9 and 10 AM Falls asleep okay 2-3 awakenings Final wake up time between 630 and 7  Outpatient Encounter Medications as of 02/08/2024  Medication Sig   apixaban  (ELIQUIS ) 5 MG TABS tablet Take 1 tablet (5 mg total) by mouth 2 (two) times daily.   aspirin  EC 81 MG tablet Take 1 tablet (81 mg total) by mouth daily. Swallow whole.   atorvastatin  (LIPITOR ) 80 MG tablet Take 1 tablet (80 mg total) by mouth daily.   CONTRAVE 8-90 MG TB12 Take by mouth.   metoprolol  tartrate (LOPRESSOR ) 25 MG tablet Take 1 tablet (25 mg total) by mouth 2 (two) times daily.   nitroGLYCERIN  (NITROSTAT ) 0.4 MG SL tablet Place 1 tablet (0.4 mg total) under the tongue every 5 (five) minutes as needed for chest pain.   No facility-administered encounter medications on file as of 02/08/2024.    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   Fatty liver    GERD (gastroesophageal reflux disease)    Joint pain    Morbid obesity (HCC)    Myocardial infarction (HCC)    Smoking greater than 10  pack years 06/18/2012   Tobacco abuse     Past Surgical History:  Procedure Laterality Date   BREAST BIOPSY Left 01/25/2022   MM LT BREAST BX W LOC DEV 1ST LESION IMAGE BX SPEC STEREO GUIDE 01/25/2022 [facility]   BREAST BIOPSY Left 01/25/2022   MM LT BREAST BX W LOC DEV EA AD LESION IMG BX SPEC STEREO GUIDE 01/25/2022 [facility]   CORONARY STENT INTERVENTION N/A 12/24/2017   Procedure: CORONARY STENT INTERVENTION;  Surgeon: Court Dorn PARAS, MD;  Location: [facility];  Service: Cardiovascular;  Laterality: N/A;   CORONARY STENT PLACEMENT  12/24/2017   LEFT HEART CATH AND CORONARY ANGIOGRAPHY N/A 12/24/2017   Procedure: LEFT HEART CATH AND CORONARY ANGIOGRAPHY;  Surgeon: Court Dorn PARAS, MD;  Location: [facility];  Service: Cardiovascular;  Laterality: N/A;   TONSILLECTOMY      Family History  Problem Relation Age of Onset   Kidney disease Mother    Diverticulitis Mother    Thyroid  disease Mother    Depression Mother    Anxiety disorder Mother    Alcohol abuse Mother    Pancreatic cancer Father    Cancer Father    Healthy Sister    Diverticulitis Sister    Uterine cancer Maternal Grandmother  Colon cancer Neg Hx    Esophageal cancer Neg Hx     Social History   Socioeconomic History   Marital status: Single    Spouse name: Not on file   Number of children: 1   Years of education: Not on file   Highest education level: Not on file  Occupational History   Occupation: [employer]  Tobacco Use   Smoking status: Former    Current packs/day: 0.50    Types: Cigarettes   Smokeless tobacco: Never   Tobacco comments:    quit after heart attack 2019  Vaping Use   Vaping status: Never Used  Substance and Sexual Activity   Alcohol use: Yes    Alcohol/week: 2.0 - 3.0 standard drinks of alcohol    Types: 1 Glasses of wine, 1 - 2 Cans of beer per week    Comment: wine   Drug use: Never   Sexual activity: Yes    Birth  control/protection: Post-menopausal  Other Topics Concern   Not on file  Social History Narrative   Not on file   Social Drivers of Health   Tobacco Use: Medium Risk (02/08/2024)   Patient History    Smoking Tobacco Use: Former    Smokeless Tobacco Use: Never    Passive Exposure: Not on file  Financial Resource Strain: Not on file  Food Insecurity: Not on file  Transportation Needs: Not on file  Physical Activity: Not on file  Stress: Not on file (11/05/2022)  Social Connections: Unknown (05/16/2021)   Received from [facility]   Social Network    Social Network: Not on file  Intimate Partner Violence: Unknown (04/07/2021)   Received from [facility]   HITS    Physically Hurt: Not on file    Insult or Talk Down To: Not on file    Threaten Physical Harm: Not on file    Scream or Curse: Not on file  Depression (EYV7-0): Not on file  Alcohol Screen: Not on file  Housing: Not on file  Utilities: Not on file  Health Literacy: Not on file    Review of Systems  Respiratory:  Positive for apnea.   Psychiatric/Behavioral:  Positive for sleep disturbance.     Vitals:   02/08/24 0911  BP: 135/67  Pulse: (!) 58  SpO2: 97%     Physical Exam Constitutional:      Appearance: She is obese.  HENT:     Head: Normocephalic.     Mouth/Throat:     Mouth: Mucous membranes are moist.  Eyes:     General: No scleral icterus.    Pupils: Pupils are equal, round, and reactive to light.  Cardiovascular:     Rate and Rhythm: Regular rhythm.     Heart sounds: No murmur heard.    No friction rub.  Pulmonary:     Effort: No respiratory distress.     Breath sounds: No stridor. No wheezing or rhonchi.  Musculoskeletal:     Cervical back: No rigidity or tenderness.  Neurological:     Mental Status: She is alert.  Psychiatric:        Mood and Affect: Mood normal.    Data Reviewed: Add a WatchPAT study in the past  Compliance data reviewed showing excellent compliance average use  of 6 hours 18 minutes AutoSet 5-20 95 percentile pressure of 9.1 Residual AHI of 0.6  Assessment/Plan: Obstructive sleep apnea - Well treated with CPAP therapy  Class III obesity - Continues to work  on weight loss efforts  Emphysema on CT - Reformed smoker - Stable with no symptoms  Follow-up in about a year  Encouraged to call with significant concerns   Jennet Epley MD [facility] 02/08/2024, 9:28 AM  CC: No ref. provider found   "

## 2024-02-08 NOTE — Patient Instructions (Signed)
 I will see you a year from now  Continue using CPAP on a nightly basis  Download from your machine shows it is working well  Call us  with any significant concerns
